# Patient Record
Sex: Male | Born: 1975 | Race: White | Hispanic: No | Marital: Single | Smoking: Never smoker
Health system: Southern US, Community
[De-identification: ages and names within clinical notes are randomized; demographics above are authoritative.]

## PROBLEM LIST (undated history)

## (undated) DIAGNOSIS — Z87898 Personal history of other specified conditions: Secondary | ICD-10-CM

## (undated) DIAGNOSIS — M25512 Pain in left shoulder: Secondary | ICD-10-CM

## (undated) DIAGNOSIS — K219 Gastro-esophageal reflux disease without esophagitis: Secondary | ICD-10-CM

## (undated) DIAGNOSIS — Z8042 Family history of malignant neoplasm of prostate: Secondary | ICD-10-CM

## (undated) DIAGNOSIS — E781 Pure hyperglyceridemia: Secondary | ICD-10-CM

## (undated) DIAGNOSIS — M109 Gout, unspecified: Secondary | ICD-10-CM

## (undated) DIAGNOSIS — Z9989 Dependence on other enabling machines and devices: Secondary | ICD-10-CM

## (undated) DIAGNOSIS — G4733 Obstructive sleep apnea (adult) (pediatric): Secondary | ICD-10-CM

## (undated) HISTORY — PX: OTHER SURGICAL HISTORY: SHX169

## (undated) HISTORY — DX: Morbid (severe) obesity due to excess calories: E66.01

## (undated) HISTORY — DX: Personal history of other specified conditions: Z87.898

## (undated) HISTORY — PX: WRIST SURGERY: SHX841

## (undated) HISTORY — DX: Family history of malignant neoplasm of prostate: Z80.42

## (undated) HISTORY — DX: Obstructive sleep apnea (adult) (pediatric): G47.33

## (undated) HISTORY — DX: Dependence on other enabling machines and devices: Z99.89

## (undated) HISTORY — PX: ESOPHAGOGASTRODUODENOSCOPY: SHX1529

## (undated) HISTORY — DX: Gastro-esophageal reflux disease without esophagitis: K21.9

## (undated) HISTORY — PX: KNEE SURGERY: SHX244

## (undated) HISTORY — DX: Pure hyperglyceridemia: E78.1

## (undated) HISTORY — DX: Pain in left shoulder: M25.512

## (undated) HISTORY — DX: Gout, unspecified: M10.9

---

## 1998-06-23 ENCOUNTER — Emergency Department (HOSPITAL_COMMUNITY): Admission: EM | Admit: 1998-06-23 | Discharge: 1998-06-23 | Payer: Self-pay | Admitting: Emergency Medicine

## 2005-09-22 HISTORY — PX: LASIK: SHX215

## 2010-01-07 ENCOUNTER — Ambulatory Visit: Payer: Self-pay | Admitting: Family Medicine

## 2010-01-07 DIAGNOSIS — M67919 Unspecified disorder of synovium and tendon, unspecified shoulder: Secondary | ICD-10-CM | POA: Insufficient documentation

## 2010-01-07 DIAGNOSIS — M719 Bursopathy, unspecified: Secondary | ICD-10-CM

## 2010-01-07 LAB — CONVERTED CEMR LAB
Bilirubin Urine: NEGATIVE
Blood in Urine, dipstick: NEGATIVE
Glucose, Urine, Semiquant: NEGATIVE
Ketones, urine, test strip: NEGATIVE
Protein, U semiquant: NEGATIVE
Specific Gravity, Urine: 1.02
WBC Urine, dipstick: NEGATIVE
pH: 5.5

## 2010-01-10 LAB — CONVERTED CEMR LAB
Albumin: 4.3 g/dL (ref 3.5–5.2)
BUN: 14 mg/dL (ref 6–23)
Basophils Absolute: 0.1 10*3/uL (ref 0.0–0.1)
CO2: 26 meq/L (ref 19–32)
Calcium: 9.3 mg/dL (ref 8.4–10.5)
Cholesterol: 181 mg/dL (ref 0–200)
Eosinophils Absolute: 0.2 10*3/uL (ref 0.0–0.7)
Glucose, Bld: 91 mg/dL (ref 70–99)
HCT: 42.6 % (ref 39.0–52.0)
HDL: 45.8 mg/dL (ref >39.00)
Hemoglobin: 14.6 g/dL (ref 13.0–17.0)
Lymphs Abs: 1.7 10*3/uL (ref 0.7–4.0)
MCHC: 34.2 g/dL (ref 30.0–36.0)
Neutro Abs: 3.9 10*3/uL (ref 1.4–7.7)
Platelets: 299 10*3/uL (ref 150.0–400.0)
Potassium: 4.1 meq/L (ref 3.5–5.1)
RDW: 13 % (ref 11.5–14.6)
Sodium: 139 meq/L (ref 135–145)
TSH: 1.96 microintl units/mL (ref 0.35–5.50)
Total Bilirubin: 0.5 mg/dL (ref 0.3–1.2)
VLDL: 33.8 mg/dL (ref 0.0–40.0)

## 2010-01-21 ENCOUNTER — Telehealth: Payer: Self-pay | Admitting: Family Medicine

## 2010-07-09 ENCOUNTER — Ambulatory Visit: Payer: Self-pay | Admitting: Family Medicine

## 2010-07-09 LAB — CONVERTED CEMR LAB
Albumin: 4.1 g/dL (ref 3.5–5.2)
Alkaline Phosphatase: 68 units/L (ref 39–117)
BUN: 19 mg/dL (ref 6–23)
Basophils Absolute: 0.1 10*3/uL (ref 0.0–0.1)
Bilirubin Urine: NEGATIVE
CO2: 26 meq/L (ref 19–32)
Calcium: 9.2 mg/dL (ref 8.4–10.5)
Cholesterol: 239 mg/dL — ABNORMAL HIGH (ref 0–200)
Creatinine, Ser: 1.3 mg/dL (ref 0.4–1.5)
Eosinophils Absolute: 0.2 10*3/uL (ref 0.0–0.7)
Glucose, Bld: 99 mg/dL (ref 70–99)
Glucose, Urine, Semiquant: NEGATIVE
Hemoglobin: 14.8 g/dL (ref 13.0–17.0)
Ketones, urine, test strip: NEGATIVE
Lymphocytes Relative: 31.6 % (ref 12.0–46.0)
Lymphs Abs: 2 10*3/uL (ref 0.7–4.0)
MCHC: 34.3 g/dL (ref 30.0–36.0)
Neutro Abs: 3.5 10*3/uL (ref 1.4–7.7)
RDW: 13 % (ref 11.5–14.6)
Sodium: 140 meq/L (ref 135–145)
Specific Gravity, Urine: 1.02
TSH: 2.53 microintl units/mL (ref 0.35–5.50)
Triglycerides: 708 mg/dL — ABNORMAL HIGH (ref 0.0–149.0)
pH: 6

## 2010-07-15 ENCOUNTER — Ambulatory Visit: Payer: Self-pay | Admitting: Family Medicine

## 2010-10-24 NOTE — Assessment & Plan Note (Signed)
Summary: cpx//lch   Vital Signs:  Patient profile:   35 year old male Height:      73 inches Weight:      317 pounds Temp:     98.4 degrees F oral Pulse rate:   88 / minute Pulse rhythm:   regular Resp:     12 per minute BP sitting:   104 / 74  (left arm) Cuff size:   large  Vitals Entered By: Sid Falcon LPN (July 15, 2010 11:32 AM) CC: CPX, labs done Is Patient Diabetic? No   History of Present Illness: Here for CPE.  PMH,SH, AND FH reviewed. Currently not exercising but plans to start soon.    Clinical Review Panels:  Lipid Management   Cholesterol:  239 (07/09/2010)   LDL (bad choesterol):  101 (01/07/2010)   HDL (good cholesterol):  35.60 (07/09/2010)  Diabetes Management   Creatinine:  1.3 (07/09/2010)  CBC   WBC:  6.3 (07/09/2010)   RBC:  4.60 (07/09/2010)   Hgb:  14.8 (07/09/2010)   Hct:  43.1 (07/09/2010)   Platelets:  288.0 (07/09/2010)   MCV  93.7 (07/09/2010)   MCHC  34.3 (07/09/2010)   RDW  13.0 (07/09/2010)   PMN:  55.2 (07/09/2010)   Lymphs:  31.6 (07/09/2010)   Monos:  9.4 (07/09/2010)   Eosinophils:  2.5 (07/09/2010)   Basophil:  1.3 (07/09/2010)  Complete Metabolic Panel   Glucose:  99 (07/09/2010)   Sodium:  140 (07/09/2010)   Potassium:  4.5 (07/09/2010)   Chloride:  107 (07/09/2010)   CO2:  26 (07/09/2010)   BUN:  19 (07/09/2010)   Creatinine:  1.3 (07/09/2010)   Albumin:  4.1 (07/09/2010)   Total Protein:  6.8 (07/09/2010)   Calcium:  9.2 (07/09/2010)   Total Bili:  0.4 (07/09/2010)   Alk Phos:  68 (07/09/2010)   SGPT (ALT):  29 (07/09/2010)   SGOT (AST):  27 (07/09/2010)   Allergies (verified): 1)  Naproxen (Naproxen)  Past History:  Past Surgical History: Last updated: 01/07/2010 Fx  left wrist, 1992-1993 Rt knee 1993  Family History: Last updated: 01/07/2010 Father, prostate cancer Both grandfathers, heart disease, hypertension  Social History: Last updated: 01/07/2010 Occupation:  Merchant navy officer Single Alcohol use-yes Regular exercise-yes Chews tobacco, 1 can per week  Risk Factors: Exercise: yes (01/07/2010)  Risk Factors: Smoking Status: never (01/07/2010) Cans of tobacco/wk: 1 can week (01/07/2010) PMH-FH-SH reviewed for relevance  Review of Systems  The patient denies anorexia, fever, weight loss, weight gain, vision loss, decreased hearing, hoarseness, chest pain, syncope, dyspnea on exertion, peripheral edema, prolonged cough, headaches, hemoptysis, abdominal pain, melena, hematochezia, severe indigestion/heartburn, hematuria, incontinence, genital sores, muscle weakness, suspicious skin lesions, transient blindness, difficulty walking, depression, unusual weight change, abnormal bleeding, enlarged lymph nodes, and testicular masses.    Physical Exam  General:  Well-developed,well-nourished,in no acute distress; alert,appropriate and cooperative throughout examination Head:  Normocephalic and atraumatic without obvious abnormalities. No apparent alopecia or balding. Eyes:  No corneal or conjunctival inflammation noted. EOMI. Perrla. Funduscopic exam benign, without hemorrhages, exudates or papilledema. Vision grossly normal. Ears:  External ear exam shows no significant lesions or deformities.  Otoscopic examination reveals clear canals, tympanic membranes are intact bilaterally without bulging, retraction, inflammation or discharge. Hearing is grossly normal bilaterally. Mouth:  Oral mucosa and oropharynx without lesions or exudates.  Teeth in good repair. Neck:  No deformities, masses, or tenderness noted. Lungs:  Normal respiratory effort, chest expands symmetrically. Lungs are clear to auscultation, no crackles  or wheezes. Heart:  Normal rate and regular rhythm. S1 and S2 normal without gallop, murmur, click, rub or other extra sounds. Abdomen:  Bowel sounds positive,abdomen soft and non-tender without masses, organomegaly or hernias noted. Genitalia:  Testes  bilaterally descended without nodularity, tenderness or masses. No scrotal masses or lesions. No penis lesions or urethral discharge. Msk:  No deformity or scoliosis noted of thoracic or lumbar spine.   Extremities:  No clubbing, cyanosis, edema, or deformity noted with normal full range of motion of all joints.   Neurologic:  alert & oriented X3, cranial nerves II-XII intact, and strength normal in all extremities.   Skin:  no rashes and no suspicious lesions.   Cervical Nodes:  No lymphadenopathy noted Psych:  normally interactive, good eye contact, not anxious appearing, and not depressed appearing.     Impression & Recommendations:  Problem # 1:  ROUTINE GENERAL MEDICAL EXAM@HEALTH  CARE FACL (ICD-V70.0) labs reviewed.  Est regular exercise.  Discussed lowering saturated and trans fats and reduction sugars and starches. Fish oil 3 gm per day for hypertriglcerides.  He prefers to avoid prescription med at this time. Repeat lipids 4-6 months.  Tdap given. Declines flu vaccine.  Other Orders: Tdap => 89yrs IM (16109) Admin 1st Vaccine (60454)  Patient Instructions: 1)  Fish oil 2 to 3 grams per day. 2)  Limit sugar and starch intake. 3)  It is important that you exercise reguarly at least 20 minutes 5 times a week. If you develop chest pain, have severe difficulty breathing, or feel very tired, stop exercising immediately and seek medical attention.  4)  You need to lose weight. Consider a lower calorie diet and regular exercise.  5)  Consider repeat lipids in 4 to 6 months.   Orders Added: 1)  Tdap => 25yrs IM [90715] 2)  Admin 1st Vaccine [90471] 3)  Est. Patient 18-39 years [99395]   Immunizations Administered:  Tetanus Vaccine:    Vaccine Type: Tdap    Site: left deltoid    Mfr: GlaxoSmithKline    Dose: 0.5 ml    Route: IM    Given by: Sid Falcon LPN    Exp. Date: 07/11/2012    Lot #: [id number]    VIS given: 08/09/08 version given July 15, 2010.   Immunizations  Administered:  Tetanus Vaccine:    Vaccine Type: Tdap    Site: left deltoid    Mfr: GlaxoSmithKline    Dose: 0.5 ml    Route: IM    Given by: Sid Falcon LPN    Exp. Date: 07/11/2012    Lot #: [id number]    VIS given: 08/09/08 version given July 15, 2010.

## 2010-10-24 NOTE — Progress Notes (Signed)
Summary: return a call, ongoing left shoulder pain  Phone Note Call from Patient Call back at Home Phone [phone]   Caller: Patient---live call Summary of Call: returning a call about labs. Initial call taken by: Warnell Forester,  Jan 21, 2010 9:09 AM  Follow-up for Phone Call        Pt informed of labs, reports he has a couple of drinks per week.  Pt will return for repeat hepatic panel in 3-4 weeks.  Pt C/O ongoing left shoulder pain, injection did not help much.  Questioning what is the next step to determine cause of pain. Follow-up by: Sid Falcon LPN,  Jan 22, 980 10:24 AM  Additional Follow-up for Phone Call Additional follow up Details #1::        We need to image shoulder but at this point I would rec ortho evaluation given duration of his symptoms.  See if pt has orthopedic preference. Additional Follow-up by: Evelena Peat MD,  Jan 21, 2010 11:50 AM    Additional Follow-up for Phone Call Additional follow up Details #2::    Yes, Dr Dominica Severin. Evelena Peat MD  Jan 21, 2010 5:25 PM  Will set up referral to [facility].  I'm not sure if Dr Amanda Pea sees shoulder problems. We will confirm. Follow-up by: Evelena Peat MD,  Jan 21, 2010 5:27 PM  Additional Follow-up for Phone Call Additional follow up Details #3:: Details for Additional Follow-up Action Taken: pt has an appt with dr Amanda Pea on 02-20-2010 @ 2:30. Pt is aware of appt. Additional Follow-up by: Warnell Forester,  Jan 22, 2010 9:58 AM

## 2010-10-24 NOTE — Assessment & Plan Note (Signed)
Summary: NEW PT EST // RS   Vital Signs:  Patient profile:   35 year old male Height:      73 inches Weight:      313 pounds BMI:     41.44 Temp:     98.3 degrees F oral Pulse rate:   72 / minute Pulse rhythm:   regular Resp:     12 per minute BP sitting:   118 / 80  (left arm) Cuff size:   regular  Vitals Entered By: Sid Falcon LPN (January 07, 2010 11:38 AM)  Nutrition Counseling: Patient's BMI is greater than 25 and therefore counseled on weight management options. CC: New to establisn, pt fasting, left shoulder pain   History of Present Illness: new patient to establish care and for complete physical examination.  Past medical history reviewed. No real chronic medical problems. Has had a couple orthopedic surgeries related to football injuries. 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 Takes no regular medications. No known drug allergies.  Family history significant for father prostate cancer. Grandfather with heart disease.  Married. Occasional alcohol use. Occasional chewing tobacco use. Does not smoke.  Left shoulder pains past 6 months. Old football injury left shoulder. Frequently lifts weights and this exacerbates. Symptoms actually somewhat better after initial exercise and warming up. Pain exacerbated by abduction and internal rotation. No history of rotator cuff tear  Preventive Screening-Counseling & Management  Alcohol-Tobacco     Smoking Status: never     Cans of tobacco/week: 1 can week  Caffeine-Diet-Exercise     Does Patient Exercise: yes  Allergies (verified): No Known Drug Allergies  Past History:  Family History: Last updated: 01/07/2010 Father, prostate cancer Both grandfathers, heart disease, hypertension  Social History: Last updated: 01/07/2010 Occupation:  Emergency planning/management officer Single Alcohol use-yes Regular exercise-yes Chews tobacco, 1 can per week  Risk Factors: Exercise: yes (01/07/2010)  Risk  Factors: Smoking Status: never (01/07/2010) Cans of tobacco/wk: 1 can week (01/07/2010)  Past Surgical History: Fx  left wrist, 1992-1993 Rt knee 1993  Family History: Father, prostate cancer Both grandfathers, heart disease, hypertension  Social History: Occupation:  Emergency planning/management officer Single Alcohol use-yes Regular exercise-yes Chews tobacco, 1 can per week Smoking Status:  never Occupation:  employed Does Patient Exercise:  yes  Review of Systems  The patient denies anorexia, fever, weight loss, weight gain, vision loss, decreased hearing, hoarseness, chest pain, syncope, dyspnea on exertion, peripheral edema, prolonged cough, headaches, hemoptysis, abdominal pain, melena, hematochezia, severe indigestion/heartburn, hematuria, incontinence, genital sores, muscle weakness, suspicious skin lesions, transient blindness, difficulty walking, depression, unusual weight change, abnormal bleeding, enlarged lymph nodes, and testicular masses.    Physical Exam  General:  Well-developed,well-nourished,in no acute distress; alert,appropriate and cooperative throughout examination Head:  Normocephalic and atraumatic without obvious abnormalities. No apparent alopecia or balding. Eyes:  No corneal or conjunctival inflammation noted. EOMI. Perrla. Funduscopic exam benign, without hemorrhages, exudates or papilledema. Vision grossly normal. Ears:  External ear exam shows no significant lesions or deformities.  Otoscopic examination reveals clear canals, tympanic membranes are intact bilaterally without bulging, retraction, inflammation or discharge. Hearing is grossly normal bilaterally. Mouth:  Oral mucosa and oropharynx without lesions or exudates.  Teeth in good repair. Neck:  No deformities, masses, or tenderness noted. Lungs:  Normal respiratory effort, chest expands symmetrically. Lungs are clear to auscultation, no crackles or wheezes. Heart:  Normal rate and regular rhythm. S1 and S2  normal without gallop, murmur, click, rub or other extra  sounds. Abdomen:  Bowel sounds positive,abdomen soft and non-tender without masses, organomegaly or hernias noted. Genitalia:  Testes bilaterally descended without nodularity, tenderness or masses. No scrotal masses or lesions. No penis lesions or urethral discharge. Msk:  left shoulder reveals no visible swelling or erythema. No a.c. joint tenderness. No bicipital tenderness. Pain with internal rotation and abduction against resistance. No definite weakness Extremities:  No clubbing, cyanosis, edema, or deformity noted with normal full range of motion of all joints.   Neurologic:  alert & oriented X3, cranial nerves II-XII intact, strength normal in all extremities, and DTRs symmetrical and normal.     Impression & Recommendations:  Problem # 1:  Preventive Health Care (ICD-V70.0) we'll check tetanus immunization status. Continue regular exercise. Obtain screening labs  Problem # 2:  ROTATOR CUFF SYNDROME, LEFT (ICD-726.10) discussed risk and benefits of steroid injection and patient wished to proceed. Shoulder prepped with Betadine. Using 23-gauge 1/2 inch needle 40 mg Depo-Medrol and 2 cc plain Xylocaine injected posterior lateral approach without difficulty. Consider plain films 2 weeks if no improvement Orders: Depo- Medrol 40mg  (J1030) Joint Aspirate / Injection, Large (20610)  Other Orders: Venipuncture (04540) TLB-Lipid Panel (80061-LIPID) TLB-BMP (Basic Metabolic Panel-BMET) (80048-METABOL) TLB-CBC Platelet - w/Differential (85025-CBCD) TLB-Hepatic/Liver Function Pnl (80076-HEPATIC) TLB-TSH (Thyroid Stimulating Hormone) (84443-TSH) UA Dipstick w/o Micro (manual) (98119)  Patient Instructions: 1)  call or be in touch within 2 weeks if shoulder pain not improving after injection  Laboratory Results   Urine Tests    Routine Urinalysis   Color: yellow Appearance: Clear Glucose: negative   (Normal Range:  Negative) Bilirubin: negative   (Normal Range: Negative) Ketone: negative   (Normal Range: Negative) Spec. Gravity: 1.020   (Normal Range: 1.003-1.035) Blood: negative   (Normal Range: Negative) pH: 5.5   (Normal Range: 5.0-8.0) Protein: negative   (Normal Range: Negative) Urobilinogen: 0.2   (Normal Range: 0-1) Nitrite: negative   (Normal Range: Negative) Leukocyte Esterace: negative   (Normal Range: Negative)    Comments: Rita Ohara  January 07, 2010 2:41 PM

## 2010-10-30 ENCOUNTER — Ambulatory Visit (INDEPENDENT_AMBULATORY_CARE_PROVIDER_SITE_OTHER): Payer: BC Managed Care – PPO | Admitting: Family Medicine

## 2010-10-30 ENCOUNTER — Encounter: Payer: Self-pay | Admitting: Family Medicine

## 2010-10-30 VITALS — BP 140/84 | Temp 99.3°F | Ht 73.5 in | Wt 318.0 lb

## 2010-10-30 DIAGNOSIS — J020 Streptococcal pharyngitis: Secondary | ICD-10-CM

## 2010-10-30 DIAGNOSIS — J069 Acute upper respiratory infection, unspecified: Secondary | ICD-10-CM

## 2010-10-30 LAB — POCT RAPID STREP A (OFFICE): Rapid Strep A Screen: NEGATIVE

## 2010-10-30 MED ORDER — AZITHROMYCIN 250 MG PO TABS: ORAL_TABLET | ORAL | Status: AC

## 2010-10-30 NOTE — Progress Notes (Signed)
  Subjective:    Patient ID: Scott Norris, male    DOB: [birth year], 35 y.o.   MRN: [id number]  HPI   Patient seen with onset of illness about one week ago. Initially developed sore throat followed by some nasal congestion. Felt somewhat better over the weekend and now has had second wave of illness with worsening sore throat , cough productive of yellow sputum and now low-grade fever. Increased malaise. Chills  Off and on. No ill contacts. Patient is nonsmoker. No significant chronic medical problems. He is trying Mucinex DM with some improvement in cough. Denies any nausea, vomiting, or diarrhea  Review of Systems  see history of present illness    Objective:   Physical Exam      patient alert and in no distress  Oropharynx is moist and pink mucosa  Neck supple with no adenopathy  Nasal exam unremarkable  Chest clear to auscultation  Heart regular rhythm and rate  Skin no rash   Assessment & Plan:   Patient presents with upper respiratory infection with second wave of illness concerning for possible bacterial sinusitis  Start Zithromax for 5 days. Take over-the-counter Mucinex and saline nasal irrigation

## 2010-11-01 ENCOUNTER — Telehealth: Payer: Self-pay | Admitting: Family Medicine

## 2010-11-01 DIAGNOSIS — R05 Cough: Secondary | ICD-10-CM

## 2010-11-01 MED ORDER — HYDROCODONE-HOMATROPINE 5-1.5 MG/5ML PO SYRP: 120.0000 mL | ORAL_SOLUTION | Freq: Four times a day (QID) | ORAL | Status: DC | PRN

## 2010-11-01 NOTE — Telephone Encounter (Signed)
Hycodan cough syrup one tsp po q 4-6 hours prn cough disp 120 ml with no refill.

## 2010-11-01 NOTE — Telephone Encounter (Signed)
Pt would like to have cough medication called to [facility].

## 2010-11-01 NOTE — Telephone Encounter (Signed)
Pt informed of Rx called in.

## 2011-04-01 ENCOUNTER — Other Ambulatory Visit (INDEPENDENT_AMBULATORY_CARE_PROVIDER_SITE_OTHER): Payer: BC Managed Care – PPO

## 2011-04-01 DIAGNOSIS — Z0000 Encounter for general adult medical examination without abnormal findings: Secondary | ICD-10-CM

## 2011-04-01 LAB — CBC WITH DIFFERENTIAL/PLATELET
Basophils Absolute: 0.1 10*3/uL (ref 0.0–0.1)
Eosinophils Relative: 3.8 % (ref 0.0–5.0)
Hemoglobin: 14.4 g/dL (ref 13.0–17.0)
Lymphocytes Relative: 30.2 % (ref 12.0–46.0)
Monocytes Relative: 9.6 % (ref 3.0–12.0)
Neutro Abs: 3.8 10*3/uL (ref 1.4–7.7)
Platelets: 274 10*3/uL (ref 150.0–400.0)
RDW: 12.3 % (ref 11.5–14.6)
WBC: 6.9 10*3/uL (ref 4.5–10.5)

## 2011-04-01 LAB — HEPATIC FUNCTION PANEL
Alkaline Phosphatase: 63 U/L (ref 39–117)
Bilirubin, Direct: 0 mg/dL (ref 0.0–0.3)
Total Bilirubin: 0.3 mg/dL (ref 0.3–1.2)
Total Protein: 7.1 g/dL (ref 6.0–8.3)

## 2011-04-01 LAB — BASIC METABOLIC PANEL
Calcium: 8.9 mg/dL (ref 8.4–10.5)
GFR: 81.06 mL/min (ref >60.00)
Glucose, Bld: 103 mg/dL — ABNORMAL HIGH (ref 70–99)
Potassium: 4.2 mEq/L (ref 3.5–5.1)
Sodium: 136 mEq/L (ref 135–145)

## 2011-04-01 LAB — TSH: TSH: 3.16 u[IU]/mL (ref 0.35–5.50)

## 2011-04-01 LAB — LIPID PANEL
HDL: 31.9 mg/dL — ABNORMAL LOW (ref >39.00)
Triglycerides: 1489 mg/dL — ABNORMAL HIGH (ref 0.0–149.0)
VLDL: 297.8 mg/dL — ABNORMAL HIGH (ref 0.0–40.0)

## 2011-04-01 LAB — LDL CHOLESTEROL, DIRECT: Direct LDL: 91.8 mg/dL

## 2011-04-01 LAB — URINALYSIS
Leukocytes, UA: NEGATIVE
Nitrite: NEGATIVE
Specific Gravity, Urine: 1.025 (ref 1.000–1.030)
Urobilinogen, UA: 0.2 (ref 0.0–1.0)
pH: 6 (ref 5.0–8.0)

## 2011-04-08 ENCOUNTER — Ambulatory Visit (INDEPENDENT_AMBULATORY_CARE_PROVIDER_SITE_OTHER): Payer: BC Managed Care – PPO | Admitting: Family Medicine

## 2011-04-08 ENCOUNTER — Encounter: Payer: Self-pay | Admitting: Family Medicine

## 2011-04-08 VITALS — BP 138/86 | HR 80 | Temp 98.0°F | Resp 12 | Ht 74.0 in | Wt 332.0 lb

## 2011-04-08 DIAGNOSIS — Z0000 Encounter for general adult medical examination without abnormal findings: Secondary | ICD-10-CM

## 2011-04-08 DIAGNOSIS — E781 Pure hyperglyceridemia: Secondary | ICD-10-CM

## 2011-04-08 MED ORDER — OMEGA-3-ACID ETHYL ESTERS 1 G PO CAPS: 2.0000 g | ORAL_CAPSULE | Freq: Two times a day (BID) | ORAL | Status: DC

## 2011-04-08 NOTE — Patient Instructions (Signed)
Reduce sugars and starches and work on weight loss and exercise.

## 2011-04-08 NOTE — Progress Notes (Signed)
  Subjective:    Patient ID: Scott Norris, male    DOB: Feb 10, 1976, 35 y.o.   MRN: [id number]  HPI Patient here for complete physical. He has no chronic medical problems. Inconsistent exercise recently. Has had some weight gain and poor diet compliance. Family history significant for father with prostate cancer. Patient does not take any medications.  Past Medical History  Diagnosis Date  . Hyperlipidemia    No past surgical history on file.  reports that he has never smoked. He does not have any smokeless tobacco history on file. His alcohol and drug histories not on file. family history includes Cancer (age of onset:64) in his father. Allergies  Allergen Reactions  . Naproxen     REACTION: GI upset       Review of Systems  Constitutional: Negative for fever, activity change, appetite change and fatigue.  HENT: Negative for ear pain, congestion and trouble swallowing.   Eyes: Negative for pain and visual disturbance.  Respiratory: Negative for cough, shortness of breath and wheezing.   Cardiovascular: Negative for chest pain and palpitations.  Gastrointestinal: Negative for nausea, vomiting, abdominal pain, diarrhea, constipation, blood in stool, abdominal distention and rectal pain.  Genitourinary: Negative for dysuria, hematuria and testicular pain.  Musculoskeletal: Negative for joint swelling and arthralgias.  Skin: Negative for rash.  Neurological: Negative for dizziness, syncope and headaches.  Hematological: Negative for adenopathy.  Psychiatric/Behavioral: Negative for confusion and dysphoric mood.       Objective:   Physical Exam  Constitutional: He is oriented to person, place, and time. He appears well-developed and well-nourished. No distress.  HENT:  Head: Normocephalic and atraumatic.  Right Ear: External ear normal.  Left Ear: External ear normal.  Mouth/Throat: Oropharynx is clear and moist.  Eyes: Conjunctivae and EOM are normal. Pupils are equal,  round, and reactive to light.  Neck: Normal range of motion. Neck supple. No thyromegaly present.  Cardiovascular: Normal rate, regular rhythm and normal heart sounds.   No murmur heard. Pulmonary/Chest: No respiratory distress. He has no wheezes. He has no rales.  Abdominal: Soft. Bowel sounds are normal. He exhibits no distension and no mass. There is no tenderness. There is no rebound and no guarding.  Musculoskeletal: He exhibits no edema.  Lymphadenopathy:    He has no cervical adenopathy.  Neurological: He is alert and oriented to person, place, and time. He displays normal reflexes. No cranial nerve deficit.  Skin: No rash noted.  Psychiatric: He has a normal mood and affect.          Assessment & Plan:  Complete physical. Labs reviewed with patient and significant for glucose 103, elevated lipids with cholesterol 253, HDL 31 and triglycerides 1489. Normal TSH. Discussed concerns about risk for pancreatitis with triglyceride. Work on weight loss and sugars starch reduction. Start Lovaza 2 g twice a day and reassess blood pressure, glucose, and fasting lipids in 3 months

## 2011-07-08 ENCOUNTER — Ambulatory Visit: Payer: BC Managed Care – PPO | Admitting: Family Medicine

## 2013-09-22 DIAGNOSIS — Z87898 Personal history of other specified conditions: Secondary | ICD-10-CM

## 2013-09-22 HISTORY — DX: Personal history of other specified conditions: Z87.898

## 2014-12-22 HISTORY — DX: Morbid (severe) obesity due to excess calories: E66.01

## 2014-12-27 ENCOUNTER — Ambulatory Visit: Payer: Self-pay | Admitting: Family Medicine

## 2015-01-03 ENCOUNTER — Ambulatory Visit: Payer: Self-pay | Admitting: Family Medicine

## 2015-01-08 ENCOUNTER — Other Ambulatory Visit (INDEPENDENT_AMBULATORY_CARE_PROVIDER_SITE_OTHER): Payer: Managed Care, Other (non HMO)

## 2015-01-08 DIAGNOSIS — Z0000 Encounter for general adult medical examination without abnormal findings: Secondary | ICD-10-CM

## 2015-01-08 DIAGNOSIS — R7989 Other specified abnormal findings of blood chemistry: Secondary | ICD-10-CM | POA: Diagnosis not present

## 2015-01-08 LAB — LIPID PANEL
Cholesterol: 221 mg/dL — ABNORMAL HIGH (ref 0–200)
HDL: 52.2 mg/dL (ref >39.00)
NonHDL: 168.8
Total CHOL/HDL Ratio: 4
Triglycerides: 245 mg/dL — ABNORMAL HIGH (ref 0.0–149.0)
VLDL: 49 mg/dL — ABNORMAL HIGH (ref 0.0–40.0)

## 2015-01-08 LAB — CBC WITH DIFFERENTIAL/PLATELET
BASOS ABS: 0.1 10*3/uL (ref 0.0–0.1)
Basophils Relative: 0.7 % (ref 0.0–3.0)
EOS PCT: 4.5 % (ref 0.0–5.0)
Eosinophils Absolute: 0.4 10*3/uL (ref 0.0–0.7)
HCT: 45.5 % (ref 39.0–52.0)
HEMOGLOBIN: 15.4 g/dL (ref 13.0–17.0)
LYMPHS PCT: 21.9 % (ref 12.0–46.0)
Lymphs Abs: 1.9 10*3/uL (ref 0.7–4.0)
MCHC: 33.8 g/dL (ref 30.0–36.0)
MCV: 91.7 fl (ref 78.0–100.0)
Monocytes Absolute: 0.7 10*3/uL (ref 0.1–1.0)
Monocytes Relative: 7.9 % (ref 3.0–12.0)
Neutro Abs: 5.6 10*3/uL (ref 1.4–7.7)
Neutrophils Relative %: 65 % (ref 43.0–77.0)
PLATELETS: 312 10*3/uL (ref 150.0–400.0)
RBC: 4.97 Mil/uL (ref 4.22–5.81)
RDW: 13.4 % (ref 11.5–15.5)
WBC: 8.6 10*3/uL (ref 4.0–10.5)

## 2015-01-08 LAB — COMPREHENSIVE METABOLIC PANEL
ALK PHOS: 70 U/L (ref 39–117)
ALT: 51 U/L (ref 0–53)
AST: 35 U/L (ref 0–37)
Albumin: 4.4 g/dL (ref 3.5–5.2)
BILIRUBIN TOTAL: 0.6 mg/dL (ref 0.2–1.2)
BUN: 15 mg/dL (ref 6–23)
CHLORIDE: 100 meq/L (ref 96–112)
CO2: 29 mEq/L (ref 19–32)
CREATININE: 1.12 mg/dL (ref 0.40–1.50)
Calcium: 9.9 mg/dL (ref 8.4–10.5)
GFR: 77.75 mL/min (ref >60.00)
GLUCOSE: 88 mg/dL (ref 70–99)
Potassium: 4.7 mEq/L (ref 3.5–5.1)
Sodium: 136 mEq/L (ref 135–145)
TOTAL PROTEIN: 7.2 g/dL (ref 6.0–8.3)

## 2015-01-08 LAB — TSH: TSH: 3.36 u[IU]/mL (ref 0.35–4.50)

## 2015-01-09 LAB — LDL CHOLESTEROL, DIRECT: LDL DIRECT: 131 mg/dL

## 2015-01-12 ENCOUNTER — Ambulatory Visit (INDEPENDENT_AMBULATORY_CARE_PROVIDER_SITE_OTHER): Payer: Managed Care, Other (non HMO) | Admitting: Family Medicine

## 2015-01-12 ENCOUNTER — Encounter: Payer: Self-pay | Admitting: Family Medicine

## 2015-01-12 VITALS — BP 142/82 | HR 98 | Temp 98.2°F | Resp 20 | Ht 74.0 in | Wt 385.0 lb

## 2015-01-12 DIAGNOSIS — E782 Mixed hyperlipidemia: Secondary | ICD-10-CM | POA: Diagnosis not present

## 2015-01-12 DIAGNOSIS — G4733 Obstructive sleep apnea (adult) (pediatric): Secondary | ICD-10-CM

## 2015-01-12 DIAGNOSIS — Z125 Encounter for screening for malignant neoplasm of prostate: Secondary | ICD-10-CM

## 2015-01-12 DIAGNOSIS — Z0000 Encounter for general adult medical examination without abnormal findings: Secondary | ICD-10-CM

## 2015-01-12 DIAGNOSIS — Z8042 Family history of malignant neoplasm of prostate: Secondary | ICD-10-CM | POA: Diagnosis not present

## 2015-01-12 MED ORDER — FLUCONAZOLE 150 MG PO TABS: ORAL_TABLET | ORAL | Status: DC

## 2015-01-12 NOTE — Progress Notes (Signed)
Pre visit review using our clinic review tool, if applicable. No additional management support is needed unless otherwise documented below in the visit note. 

## 2015-01-12 NOTE — Progress Notes (Signed)
Office Note 01/12/2015  CC:  Chief Complaint  Patient presents with  . Establish Care   HPI:  Scott Norris is a 39 y.o. White male who is here to establish care.  Patient's most recent primary MD: Dr. Caryl NeverBurchette locally, last saw him 2012.  [place] then [place] [facility] [place] and saw a dr. Fredia SorrowBailes.  He has now relocated to [place] again. Old records in EPIC/HL were reviewed prior to or during today's visit.  Pt has had a sleep study about 6 yrs ago.  Has a CPAP machine that has been broken for 6-8 months  Says sleep is impaired, daytime sleepiness has returned.  Needs pulm referral to get another sleep study/CPAP re-initiated.  Asks for CPE today.  He got fasting CBC, CMET, TSH, and FLP 5 d/a and we reviewed these in detail today.  Only abnormality was mild elevation of trigs.    Pt's father dx'd with prostate ca screening at age 39 and pt has never been screened.  Complains of "long" history of itchy rash on lower abdomen down into groin and all over buttocks/thighs.  The skin is a bit flaky and darker pigmented than the surrounding skin.  Has been dx'd with fungal skin infection and was told to try various OTC antifungal meds and says he was rx'd one in the past as well (topical) and none have made a significant difference for any extendeded period of time.    Past Medical History  Diagnosis Date  . Hyperlipemia, mixed   . Morbid obesity 12/2014    BMI 49    Past Surgical History  Procedure Laterality Date  . Wrist surgery      Fractured wrist; no hardware in wrist.  . Knee surgery Right     Arthroscopic  . Lasik  2007    Family History  Problem Relation Age of Onset  . Prostate cancer Father [id number]  . Hypertension Father     History   Social History  . Marital Status: Single    Spouse Name: N/A  . Number of Children: N/A  . Years of Education: N/A   Occupational History  . Not on file.   Social History Main Topics  . Smoking status: Never  Smoker   . Smokeless tobacco: Current User    Types: Snuff  . Alcohol Use: 1.2 oz/week    2 Shots of liquor per week     Comment: 4-5 weekly  . Drug Use: No  . Sexual Activity: Not on file   Other Topics Concern  . Not on file   Social History Narrative   Married, 2 y/o son.   Occupation: Scientist, physiologicalproject manager for commercial construction.   College: [employer]: BS.  Also played football for [employer].   No tobacco.  Drinks 2 bourbon drinks 4-5 days a week.   Dips tobacco.            MEDS: ASA 81mg  qd, rx antifungal pt cannot recall name of--says it is likely expired. Pt has also tried various otc antifungal creams for his tinea cruris.  Allergies  Allergen Reactions  . Naproxen     REACTION: GI upset    ROS Review of Systems  Constitutional: Negative for fever, chills, appetite change and fatigue.  HENT: Negative for congestion, dental problem, ear pain and sore throat.   Eyes: Negative for discharge, redness and visual disturbance.  Respiratory: Negative for cough, chest tightness, shortness of breath and wheezing.  Cardiovascular: Negative for chest pain, palpitations and leg swelling.  Gastrointestinal: Negative for nausea, vomiting, abdominal pain, diarrhea and blood in stool.  Genitourinary: Negative for dysuria, urgency, frequency, hematuria, flank pain and difficulty urinating.  Musculoskeletal: Negative for myalgias, back pain, joint swelling, arthralgias and neck stiffness.  Skin: Negative for pallor and rash.  Neurological: Negative for dizziness, speech difficulty, weakness and headaches.  Hematological: Negative for adenopathy. Does not bruise/bleed easily.  Psychiatric/Behavioral: Positive for sleep disturbance (as per HPI). Negative for confusion. The patient is not nervous/anxious.     PE; Blood pressure 142/82, pulse 98, temperature 98.2 F (36.8 C), temperature source Temporal, resp. rate 20, height  (1.88 m), weight 385 lb (174.635 kg), SpO2 95  %. Gen: Alert, well appearing, morbidly obese WM.  Patient is oriented to person, place, time, and situation. AFFECT: pleasant, lucid thought and speech. ENT: Ears: EACs clear, normal epithelium.  TMs with good light reflex and landmarks bilaterally.  Eyes: no injection, icteris, swelling, or exudate.  EOMI, PERRLA. Nose: no drainage or turbinate edema/swelling.  No injection or focal lesion.  Mouth: lips without lesion/swelling.  Oral mucosa pink and moist.  Dentition intact and without obvious caries or gingival swelling.  Oropharynx without erythema, exudate, or swelling.  Neck: supple/nontender.  No LAD, mass, or TM.  Carotid pulses 2+ bilaterally, without bruits. CV: RRR, no m/r/g.   LUNGS: CTA bilat, nonlabored resps, good aeration in all lung fields. ABD: soft, NT, ND, BS normal.  No hepatospenomegaly or mass.  No bruits. EXT: no clubbing, cyanosis, or edema.  Musculoskeletal: no joint swelling, erythema, warmth, or tenderness.  ROM of all joints intact. Skin - no sores or suspicious lesions.  He has extensive rash on lower abd under pannus, in groin, and on upper thighs and gluteal region--flaky hyperpigmented discoloration, rash not so much palpable as it is visible, borders well demarcated.  No skin maceration.  No pustules or vesicles. Rectal exam: negative without mass, lesions or tenderness, PROSTATE EXAM: smooth and symmetric without nodules or tenderness.   Pertinent labs:  Lab Results  Component Value Date   TSH 3.36 01/08/2015   Lab Results  Component Value Date   WBC 8.6 01/08/2015   HGB 15.4 01/08/2015   HCT 45.5 01/08/2015   MCV 91.7 01/08/2015   PLT 312.0 01/08/2015   Lab Results  Component Value Date   CREATININE 1.12 01/08/2015   BUN 15 01/08/2015   NA 136 01/08/2015   K 4.7 01/08/2015   CL 100 01/08/2015   CO2 29 01/08/2015   Lab Results  Component Value Date   ALT 51 01/08/2015   AST 35 01/08/2015   ALKPHOS 70 01/08/2015   BILITOT 0.6 01/08/2015    Lab Results  Component Value Date   CHOL 221* 01/08/2015   Lab Results  Component Value Date   HDL 52.20 01/08/2015   Lab Results  Component Value Date   LDLCALC 101* 01/07/2010   Lab Results  Component Value Date   TRIG 245.0* 01/08/2015   Lab Results  Component Value Date   CHOLHDL 4 01/08/2015    ASSESSMENT AND PLAN:   New pt; will obtain prior PCP [facility].  1) OSA, was on CPAP but machine is broken. Pt indicates that his initial sleep study was 5-6 years ago and therefore I'll ask pulm/sleep medicine to see him b/c he'll need repeat sleep study before any new CPAP machine or settings could be recommended.  2) Hyperlipidemia, mixed.  Mild, no  indication for medication treatment at this time. Emphasized the importance of diet/exercise/wt loss in controlling this problem.  3) Morbid obesity; he is not currently dieting or exercising.  We discussed some basics of this today and how to get started.  4) Tinea cruris, extensive.  This has been refractory to topical treatments.  Will treat with diflucan 150 mg qd x 14d and pt is to continue OTC lamisil cream bid and try to keep the involved skin surfaces dry.  5) Health maintenance exam:  Reviewed age and gender appropriate health maintenance issues (prudent diet, regular exercise, health risks of tobacco and excessive alcohol, use of seatbelts, fire alarms in home, use of sunscreen).  Also reviewed age and gender appropriate health screening as well as vaccine recommendations. UTD on vaccines.  He currently takes an aspirin  qd but I recommended he discontinue this b/c at this time he has no indication for this med. Reviewed HP labs today.  6) Family history of prostate cancer: father at age 39.  Pt wants to be aggressive and I'm fine with starting screening now and repeating annually.  DRE normal today.  PSA drawn today.  An After Visit Summary was printed and given to the patient.  Return in about 1 year  (around 01/12/2016) for annual CPE (fasting).

## 2015-01-13 LAB — PSA: PSA: 0.46 ng/mL (ref <=4.00)

## 2015-01-14 ENCOUNTER — Encounter: Payer: Self-pay | Admitting: Family Medicine

## 2015-01-19 ENCOUNTER — Encounter: Payer: Self-pay | Admitting: Family Medicine

## 2015-06-29 ENCOUNTER — Ambulatory Visit (HOSPITAL_BASED_OUTPATIENT_CLINIC_OR_DEPARTMENT_OTHER)
Admission: RE | Admit: 2015-06-29 | Discharge: 2015-06-29 | Disposition: A | Discharge status: Home / Self Care | Payer: Managed Care, Other (non HMO) | Source: Ambulatory Visit | Attending: Family Medicine | Admitting: Family Medicine

## 2015-06-29 ENCOUNTER — Ambulatory Visit (INDEPENDENT_AMBULATORY_CARE_PROVIDER_SITE_OTHER): Payer: Managed Care, Other (non HMO) | Admitting: Family Medicine

## 2015-06-29 ENCOUNTER — Encounter: Payer: Self-pay | Admitting: Family Medicine

## 2015-06-29 VITALS — BP 140/82 | HR 98 | Temp 98.3°F | Resp 18 | Ht 74.0 in | Wt 386.0 lb

## 2015-06-29 DIAGNOSIS — R079 Chest pain, unspecified: Secondary | ICD-10-CM | POA: Diagnosis not present

## 2015-06-29 DIAGNOSIS — G4733 Obstructive sleep apnea (adult) (pediatric): Secondary | ICD-10-CM | POA: Insufficient documentation

## 2015-06-29 DIAGNOSIS — R0789 Other chest pain: Secondary | ICD-10-CM

## 2015-06-29 DIAGNOSIS — R030 Elevated blood-pressure reading, without diagnosis of hypertension: Secondary | ICD-10-CM

## 2015-06-29 NOTE — Progress Notes (Addendum)
OFFICE VISIT  06/29/2015   CC:  Chief Complaint  Patient presents with  . Chest Pain    tightness on left side of chest, off and on x 3 weeks   HPI:    Patient is a 39 y.o. Caucasian male who presents for 3-4 wk hx of an intermittent tightness/pressure present on L side of chest, comes and goes.  More often feels it after he eats.  Exertion does not make it start or make it worse.  Lasts 3-4 minutes typically, comes and goes throughout the day. Points to area under L pectoral mm.  No nausea, SOB, palpitations, dizziness, or radiation of pain.  No jaw or arm pain.  No excessive activity prior to onset, no trauma.   He can't identify anything that alleviates the feeling.  ROS: feet hurting last few weeks in the metarsal head region/balls of feet.  No change in mild chronic ankle swelling/LL edema 1+. No heartburn or GER with any regularity He reports compliance with CPAP nightly since getting it about 1 mo ago. No PND, orthopnea. Last few weeks has noted intermittent L thigh burning sensation laterally, mostly noted upon awakening in mornings.  No rash.  Past Medical History  Diagnosis Date  . Hyperlipemia, mixed   . Morbid obesity ([facility]) 12/2014    BMI 49  . Family history of prostate cancer     Father  . OSA on CPAP     Past Surgical History  Procedure Laterality Date  . Wrist surgery      Fractured wrist; no hardware in wrist.  . Knee surgery Right     Arthroscopic  . Lasik  2007   MEDS: none   Allergies  Allergen Reactions  . Naproxen     REACTION: GI upset    ROS As per HPI  PE: Blood pressure 140/82, pulse 98, temperature 98.3 F (36.8 C), temperature source Oral, resp. rate 18, height  (1.88 m), weight 386 lb (175.088 kg), SpO2 95 %. Repeat bp 160/100 L forearm.  Repeat in L upper arm was 140/82. Gen: Alert, well appearing, morbidly obese WM.  Patient is oriented to person, place, time, and situation. ZOX:WRUE: no injection, icteris, swelling, or  exudate.  EOMI, PERRLA. Mouth: lips without lesion/swelling.  Oral mucosa pink and moist. Oropharynx without erythema, exudate, or swelling.  Neck - No masses or thyromegaly or limitation in range of motion CV: RRR, no m/r/g.   Chest wall is nontender to palpation. LUNGS: CTA bilat, nonlabored resps, good aeration in all lung fields. ABD: soft, NT, ND, BS normal.  No hepatospenomegaly or mass.  No bruits. EXT: 1+ bilat pitting edema in LL's, with a bit more general hypertrophy to the ankles R>L. No plantar foot tenderness.   LABS:  12 lead EKG today: NSR, rate 89, incomplete RBBB, no hypertrophy or ischemic changes or ectopy present. Low voltage III and aVF.  No prior EKG available for comparison.  Lab Results  Component Value Date   WBC 8.6 01/08/2015   HGB 15.4 01/08/2015   HCT 45.5 01/08/2015   MCV 91.7 01/08/2015   PLT 312.0 01/08/2015     Chemistry      Component Value Date/Time   NA 136 01/08/2015 0849   K 4.7 01/08/2015 0849   CL 100 01/08/2015 0849   CO2 29 01/08/2015 0849   BUN 15 01/08/2015 0849   CREATININE 1.12 01/08/2015 0849      Component Value Date/Time   CALCIUM 9.9 01/08/2015 0849  ALKPHOS 70 01/08/2015 0849   AST 35 01/08/2015 0849   ALT 51 01/08/2015 0849   BILITOT 0.6 01/08/2015 0849     Lab Results  Component Value Date   CHOL 221* 01/08/2015   HDL 52.20 01/08/2015   LDLCALC 101* 01/07/2010   LDLDIRECT 131.0 01/08/2015   TRIG 245.0* 01/08/2015   CHOLHDL 4 01/08/2015   Lab Results  Component Value Date   TSH 3.36 01/08/2015    IMPRESSION AND PLAN:  1) Atypical chest discomfort/pain:  EKG reassuring. Will check CXR and arrange ETT.  2) Elevated bp w/out dx of HTN:  Patient states no prior hx of bp elevation at MD offices or when giving blood.  Patient is to monitor his bp at pharmacy or his home daily for the next week and call these #s to our office.  Spent 50 min with pt today, with >50% of this time spent in counseling and care  coordination regarding the above problems.  An After Visit Summary was printed and given to the patient.  FOLLOW UP: Return for f/u to be determined based on results of w/u.

## 2015-06-29 NOTE — Progress Notes (Signed)
Pre visit review using our clinic review tool, if applicable. No additional management support is needed unless otherwise documented below in the visit note. 

## 2015-07-01 ENCOUNTER — Encounter: Payer: Self-pay | Admitting: Family Medicine

## 2015-07-03 ENCOUNTER — Encounter: Payer: Managed Care, Other (non HMO) | Admitting: Nurse Practitioner

## 2015-07-03 ENCOUNTER — Ambulatory Visit (INDEPENDENT_AMBULATORY_CARE_PROVIDER_SITE_OTHER): Payer: Managed Care, Other (non HMO)

## 2015-07-03 DIAGNOSIS — R079 Chest pain, unspecified: Secondary | ICD-10-CM

## 2015-07-03 LAB — EXERCISE TOLERANCE TEST
CHL RATE OF PERCEIVED EXERTION: 17
CSEPED: 8 min
CSEPEW: 10.4 METS
CSEPHR: 87 %
CSEPPHR: 157 {beats}/min
Exercise duration (sec): 0 s
MPHR: 181 {beats}/min
Rest HR: 90 {beats}/min

## 2015-07-04 ENCOUNTER — Encounter: Payer: Self-pay | Admitting: Family Medicine

## 2015-07-04 NOTE — Progress Notes (Signed)
I think something in his chest wall (muscle/ligament/tendon) is irritated.   It is okay to simply wait and see how it goes over the next month or two. No further testing is needed.-thx

## 2015-11-22 ENCOUNTER — Ambulatory Visit (INDEPENDENT_AMBULATORY_CARE_PROVIDER_SITE_OTHER): Payer: Managed Care, Other (non HMO) | Admitting: Family Medicine

## 2015-11-22 ENCOUNTER — Encounter: Payer: Self-pay | Admitting: Family Medicine

## 2015-11-22 VITALS — BP 134/82 | HR 78 | Temp 98.0°F | Resp 20 | Wt 367.8 lb

## 2015-11-22 DIAGNOSIS — R079 Chest pain, unspecified: Secondary | ICD-10-CM | POA: Insufficient documentation

## 2015-11-22 DIAGNOSIS — R0789 Other chest pain: Secondary | ICD-10-CM | POA: Diagnosis not present

## 2015-11-22 DIAGNOSIS — K219 Gastro-esophageal reflux disease without esophagitis: Secondary | ICD-10-CM | POA: Diagnosis not present

## 2015-11-22 LAB — COMPREHENSIVE METABOLIC PANEL
ALK PHOS: 67 U/L (ref 39–117)
ALT: 42 U/L (ref 0–53)
AST: 30 U/L (ref 0–37)
Albumin: 4.6 g/dL (ref 3.5–5.2)
BUN: 13 mg/dL (ref 6–23)
CO2: 25 meq/L (ref 19–32)
Calcium: 9.6 mg/dL (ref 8.4–10.5)
Chloride: 101 mEq/L (ref 96–112)
Creatinine, Ser: 1.03 mg/dL (ref 0.40–1.50)
GFR: 85.25 mL/min (ref >60.00)
GLUCOSE: 89 mg/dL (ref 70–99)
POTASSIUM: 4.5 meq/L (ref 3.5–5.1)
SODIUM: 135 meq/L (ref 135–145)
TOTAL PROTEIN: 7.2 g/dL (ref 6.0–8.3)
Total Bilirubin: 0.7 mg/dL (ref 0.2–1.2)

## 2015-11-22 LAB — H. PYLORI ANTIBODY, IGG: H Pylori IgG: NEGATIVE

## 2015-11-22 LAB — LIPASE: Lipase: 22 U/L (ref 11.0–59.0)

## 2015-11-22 MED ORDER — OMEPRAZOLE 40 MG PO CPDR: 40.0000 mg | DELAYED_RELEASE_CAPSULE | Freq: Every day | ORAL | Status: DC

## 2015-11-22 MED ORDER — SUCRALFATE 1 G PO TABS: 1.0000 g | ORAL_TABLET | Freq: Three times a day (TID) | ORAL | Status: DC

## 2015-11-22 NOTE — Progress Notes (Signed)
Patient ID: Scott Norris, male   DOB: 1976/05/29, 40 y.o.   MRN: [id number]    Scott Norris , [birth year], 40 y.o., male MRN: [id number]  CC: Chest tightness Subjective: Pt presents for an acute OV with complaints of chest tightness of since October. The pain occurs on the left lower chest area, approximately 6-8 times a day. He states it happens after every meal, and occasionally intermittently throughout the day. He states it did happen this morning and he did not eat anything, then on further questioning states he did have coffee. He reports the pain starts approximately 20-30 minutes after meals, and feels like a burning sensation. He states the pain can last anywhere from a few minutes to an hour. Pain resolves on its own. He denies any nausea, vomit, diaphoresis, shortness of breath. Patient has gained weight and is obese. Patient denies any prior history of reflux, but admits to heartburn at least 5-6 times a week. He was evaluated for the same symptoms in October, he states the symptoms have not changed. He had a exercise stress test at that time that was normal.  Patient does not consume alcohol. He does have an elevated triglyceride level by prior lipid panel. He states he used to take fish oil, but he hasn't been taking that for a few months. He denies any changes in his bowel habits, melena or hematochezia. He denies any family history of bowel disease.   Allergies  Allergen Reactions  . Naproxen     REACTION: GI upset   Social History  Substance Use Topics  . Smoking status: Never Smoker   . Smokeless tobacco: Former Systems developer    Types: Snuff  . Alcohol Use: 1.2 oz/week    2 Shots of liquor per week     Comment: 4-5 weekly   Past Medical History  Diagnosis Date  . Hypertriglyceridemia     Trigs>1400 in 2012: lovaza rx'd  . Morbid obesity ([place]) 12/2014    BMI 49  . Family history of prostate cancer     Father  . OSA on CPAP   . GERD (gastroesophageal reflux disease)   .  History of chest pain 09/2013    Ruled out for ACS at [facility] ED.  Also dx'd with musculoskeletal CP, costochondritis, and GERD for c/o CP in the past.  ETT normal 06/2015.   Past Surgical History  Procedure Laterality Date  . Wrist surgery      Fractured wrist; no hardware in wrist.  . Knee surgery Right     Arthroscopic  . Lasik  2007  . Ett  06/2015    Normal   Family History  Problem Relation Age of Onset  . Prostate cancer Father 59  . Hypertension Father      Medication List    Notice  As of 11/22/2015 11:24 AM   You have not been prescribed any medications.      ROS: Negative, with the exception of above mentioned in HPI  Objective:  BP 134/82 mmHg  Pulse 78  Temp(Src) 98 F (36.7 C)  Resp 20  Wt 367 lb 12 oz (166.81 kg)  SpO2 98% Body mass index is 47.2 kg/(m^2). Gen: Afebrile. No acute distress. Nontoxic in appearance, well-developed, well-nourished, Caucasian obese male. HENT: AT. .MMM, no oral lesions.  Eyes:Pupils Equal Round Reactive to light, Extraocular movements intact,  Conjunctiva without redness, discharge or icterus. Neck/lymp/endocrine: Supple, no lymphadenopathy CV: RRR no murmur, clicks, gallops or rubs,  no edema, +2/4  P posterior tibialis pulses Chest: CTAB, no wheeze or crackles. Good air movement, normal resp effort.  Abd: Soft. Morbidly obese. NTND. BS present. No Masses palpated. No rebound or guarding. No hepatosplenomegaly. Neuro: Normal gait. PERLA. EOMi. Alert. Oriented x3  Psych: Normal affect, dress and demeanor. Normal speech. Normal thought content and judgment.  Assessment/Plan: Scott Norris is a 40 y.o. male present for acute OV for  1. Other chest pain - Discussed with patient possible etiologies of discomfort. Differential diagnosis: Hiatal hernia, pleuritic/pericardial catch, pancreatic etiology, PUD/GERD. Recent CXR reviewed and normal.  - Exam today is normal. Patient's report of pain is more left-sided, discussed  diaphragmatic irritation pain. Patient had a stress test in October which was normal. By EMR reviewed does have elevated triglycerides.  - Comp Met (CMET) - Lipase - H. pylori antibody, IgG - Patient will be called with results once available. Discussed treatment with Prilosec and Carafate. Patient is to monitor his diet closely, following a GERD diet. This was provided to him today. AVS information on hiatal hernia was also provided to patient. He was encouraged to lose weight, if this is a hiatal hernia would help his symptoms. We also discussed he may need GI referral for all studies come back normal and he does not respond to the GERD treatment.  2. Gastroesophageal reflux disease, esophagitis presence not specified - omeprazole (PRILOSEC) 40 MG capsule; Take 1 capsule (40 mg total) by mouth daily.  Dispense: 30 capsule; Refill: 3 - sucralfate (CARAFATE) 1 g tablet; Take 1 tablet (1 g total) by mouth 4 (four) times daily -  with meals and at bedtime.  Dispense: 90 tablet; Refill: 1 - GERD diet - Follow-up in 4 weeks if no improvement in symptoms, sooner if worsening.  electronically signed by:  Howard Pouch, DO  [facility]

## 2015-11-22 NOTE — Patient Instructions (Signed)
Hiatal Hernia A hiatal hernia occurs when part of your stomach slides above the muscle that separates your abdomen from your chest (diaphragm). You can be born with a hiatal hernia (congenital), or it may develop over time. In almost all cases of hiatal hernia, only the top part of the stomach pushes through.  Many people have a hiatal hernia with no symptoms. The larger the hernia, the more likely that you will have symptoms. In some cases, a hiatal hernia allows stomach acid to flow back into the tube that carries food from your mouth to your stomach (esophagus). This may cause heartburn symptoms. Severe heartburn symptoms may mean you have developed a condition called gastroesophageal reflux disease (GERD).  CAUSES  Hiatal hernias are caused by a weakness in the opening (hiatus) where your esophagus passes through your diaphragm to attach to the upper part of your stomach. You may be born with a weakness in your hiatus, or a weakness can develop. RISK FACTORS Older age is a major risk factor for a hiatal hernia. Anything that increases pressure on your diaphragm can also increase your risk of a hiatal hernia. This includes:  Pregnancy.  Excess weight.  Frequent constipation. SIGNS AND SYMPTOMS  People with a hiatal hernia often have no symptoms. If symptoms develop, they are almost always caused by GERD. They may include:  Heartburn.  Belching.  Indigestion.  Trouble swallowing.  Coughing or wheezing.  Sore throat.  Hoarseness.  Chest pain. DIAGNOSIS  A hiatal hernia is sometimes found during an exam for another problem. Your health care provider may suspect a hiatal hernia if you have symptoms of GERD. Tests may be done to diagnose GERD. These may include:  X-rays of your stomach or chest.  An upper gastrointestinal (GI) series. This is an X-ray exam of your GI tract involving the use of a chalky liquid that you swallow. The liquid shows up clearly on the X-ray.  Endoscopy.  This is a procedure to look into your stomach using a thin, flexible tube that has a tiny camera and light on the end of it. TREATMENT  If you have no symptoms, you may not need treatment. If you have symptoms, treatment may include:  Dietary and lifestyle changes to help reduce GERD symptoms.  Medicines. These may include:  Over-the-counter antacids.  Medicines that make your stomach empty more quickly.  Medicines that block the production of stomach acid (H2 blockers).  Stronger medicines to reduce stomach acid (proton pump inhibitors).  You may need surgery to repair the hernia if other treatments are not helping. HOME CARE INSTRUCTIONS   Take all medicines as directed by your health care provider.  Quit smoking, if you smoke.  Try to achieve and maintain a healthy body weight.  Eat frequent small meals instead of three large meals a day. This keeps your stomach from getting too full.  Eat slowly.  Do not lie down right after eating.  Do noteat 1-2 hours before bed.   Do not drink beverages with caffeine. These include cola, coffee, cocoa, and tea.  Do not drink alcohol.  Avoid foods that can make symptoms of GERD worse. These may include:  Fatty foods.  Citrus fruits.  Other foods and drinks that contain acid.  Avoid putting pressure on your belly. Anything that puts pressure on your belly increases the amount of acid that may be pushed up into your esophagus.   Avoid bending over, especially after eating.  Raise the head of your bed   by putting blocks under the legs. This keeps your head and esophagus higher than your stomach.  Do not wear tight clothing around your chest or stomach.  Try not to strain when having a bowel movement, when urinating, or when lifting heavy objects. SEEK MEDICAL CARE IF:  Your symptoms are not controlled with medicines or lifestyle changes.  You are having trouble swallowing.  You have coughing or wheezing that will not  go away. SEEK IMMEDIATE MEDICAL CARE IF:  Your pain is getting worse.  Your pain spreads to your arms, neck, jaw, teeth, or back.  You have shortness of breath.  You sweat for no reason.  You feel sick to your stomach (nauseous) or vomit.  You vomit blood.  You have bright red blood in your stools.  You have black, tarry stools.    This information is not intended to replace advice given to you by your health care provider. Make sure you discuss any questions you have with your health care provider.   Document Released: 11/29/2003 Document Revised: 09/29/2014 Document Reviewed: 08/26/2013 Elsevier Interactive Patient Education 2016 [employer].  Food Choices for Gastroesophageal Reflux Disease, Adult When you have gastroesophageal reflux disease (GERD), the foods you eat and your eating habits are very important. Choosing the right foods can help ease the discomfort of GERD. WHAT GENERAL GUIDELINES DO I NEED TO FOLLOW?  Choose fruits, vegetables, whole grains, low-fat dairy products, and low-fat meat, fish, and poultry.  Limit fats such as oils, salad dressings, butter, nuts, and avocado.  Keep a food diary to identify foods that cause symptoms.  Avoid foods that cause reflux. These may be different for different people.  Eat frequent small meals instead of three large meals each day.  Eat your meals slowly, in a relaxed setting.  Limit fried foods.  Cook foods using methods other than frying.  Avoid drinking alcohol.  Avoid drinking large amounts of liquids with your meals.  Avoid bending over or lying down until 2-3 hours after eating. WHAT FOODS ARE NOT RECOMMENDED? The following are some foods and drinks that may worsen your symptoms: Vegetables Tomatoes. Tomato juice. Tomato and spaghetti sauce. Chili peppers. Onion and garlic. Horseradish. Fruits Oranges, grapefruit, and lemon (fruit and juice). Meats High-fat meats, fish, and poultry. This includes  hot dogs, ribs, ham, sausage, salami, and bacon. Dairy Whole milk and chocolate milk. Sour cream. Cream. Butter. Ice cream. Cream cheese.  Beverages Coffee and tea, with or without caffeine. Carbonated beverages or energy drinks. Condiments Hot sauce. Barbecue sauce.  Sweets/Desserts Chocolate and cocoa. Donuts. Peppermint and spearmint. Fats and Oils High-fat foods, including [place] fries and potato chips. Other Vinegar. Strong spices, such as black pepper, white pepper, red pepper, cayenne, curry powder, cloves, ginger, and chili powder. The items listed above may not be a complete list of foods and beverages to avoid. Contact your dietitian for more information.   This information is not intended to replace advice given to you by your health care provider. Make sure you discuss any questions you have with your health care provider.   Document Released: 09/08/2005 Document Revised: 09/29/2014 Document Reviewed: 07/13/2013 Elsevier Interactive Patient Education 2016 Elsevier Inc.   Gastroesophageal Reflux Disease, Adult Normally, food travels down the esophagus and stays in the stomach to be digested. However, when a person has gastroesophageal reflux disease (GERD), food and stomach acid move back up into the esophagus. When this happens, the esophagus becomes sore and inflamed. Over time, GERD can create small  holes (ulcers) in the lining of the esophagus.  CAUSES This condition is caused by a problem with the muscle between the esophagus and the stomach (lower esophageal sphincter, or LES). Normally, the LES muscle closes after food passes through the esophagus to the stomach. When the LES is weakened or abnormal, it does not close properly, and that allows food and stomach acid to go back up into the esophagus. The LES can be weakened by certain dietary substances, medicines, and medical conditions, including:  Tobacco use.  Pregnancy.  Having a hiatal hernia.  Heavy alcohol  use.  Certain foods and beverages, such as coffee, chocolate, onions, and peppermint. RISK FACTORS This condition is more likely to develop in:  People who have an increased body weight.  People who have connective tissue disorders.  People who use NSAID medicines. SYMPTOMS Symptoms of this condition include:  Heartburn.  Difficult or painful swallowing.  The feeling of having a lump in the throat.  Abitter taste in the mouth.  Bad breath.  Having a large amount of saliva.  Having an upset or bloated stomach.  Belching.  Chest pain.  Shortness of breath or wheezing.  Ongoing (chronic) cough or a night-time cough.  Wearing away of tooth enamel.  Weight loss. Different conditions can cause chest pain. Make sure to see your health care provider if you experience chest pain. DIAGNOSIS Your health care provider will take a medical history and perform a physical exam. To determine if you have mild or severe GERD, your health care provider may also monitor how you respond to treatment. You may also have other tests, including:  An endoscopy toexamine your stomach and esophagus with a small camera.  A test thatmeasures the acidity level in your esophagus.  A test thatmeasures how much pressure is on your esophagus.  A barium swallow or modified barium swallow to show the shape, size, and functioning of your esophagus. TREATMENT The goal of treatment is to help relieve your symptoms and to prevent complications. Treatment for this condition may vary depending on how severe your symptoms are. Your health care provider may recommend:  Changes to your diet.  Medicine.  Surgery. HOME CARE INSTRUCTIONS Diet  Follow a diet as recommended by your health care provider. This may involve avoiding foods and drinks such as:  Coffee and tea (with or without caffeine).  Drinks that containalcohol.  Energy drinks and sports drinks.  Carbonated drinks or  sodas.  Chocolate and cocoa.  Peppermint and mint flavorings.  Garlic and onions.  Horseradish.  Spicy and acidic foods, including peppers, chili powder, curry powder, vinegar, hot sauces, and barbecue sauce.  Citrus fruit juices and citrus fruits, such as oranges, lemons, and limes.  Tomato-based foods, such as red sauce, chili, salsa, and pizza with red sauce.  Fried and fatty foods, such as donuts, french fries, potato chips, and high-fat dressings.  High-fat meats, such as hot dogs and fatty cuts of red and white meats, such as rib eye steak, sausage, ham, and bacon.  High-fat dairy items, such as whole milk, butter, and cream cheese.  Eat small, frequent meals instead of large meals.  Avoid drinking large amounts of liquid with your meals.  Avoid eating meals during the 2-3 hours before bedtime.  Avoid lying down right after you eat.  Do not exercise right after you eat. General Instructions  Pay attention to any changes in your symptoms.  Take over-the-counter and prescription medicines only as told by your health care  provider. Do not take aspirin, ibuprofen, or other NSAIDs unless your health care provider told you to do so.  Do not use any tobacco products, including cigarettes, chewing tobacco, and e-cigarettes. If you need help quitting, ask your health care provider.  Wear loose-fitting clothing. Do not wear anything tight around your waist that causes pressure on your abdomen.  Raise (elevate) the head of your bed 6 inches (15cm).  Try to reduce your stress, such as with yoga or meditation. If you need help reducing stress, ask your health care provider.  If you are overweight, reduce your weight to an amount that is healthy for you. Ask your health care provider for guidance about a safe weight loss goal.  Keep all follow-up visits as told by your health care provider. This is important. SEEK MEDICAL CARE IF:  You have new symptoms.  You have  unexplained weight loss.  You have difficulty swallowing, or it hurts to swallow.  You have wheezing or a persistent cough.  Your symptoms do not improve with treatment.  You have a hoarse voice. SEEK IMMEDIATE MEDICAL CARE IF:  You have pain in your arms, neck, jaw, teeth, or back.  You feel sweaty, dizzy, or light-headed.  You have chest pain or shortness of breath.  You vomit and your vomit looks like blood or coffee grounds.  You faint.  Your stool is bloody or black.  You cannot swallow, drink, or eat.   This information is not intended to replace advice given to you by your health care provider. Make sure you discuss any questions you have with your health care provider.   Document Released: 06/18/2005 Document Revised: 05/30/2015 Document Reviewed: 01/03/2015   Peptic Ulcer A peptic ulcer is a sore in the lining of your esophagus (esophageal ulcer), stomach (gastric ulcer), or in the first part of your small intestine (duodenal ulcer). The ulcer causes erosion into the deeper tissue. CAUSES  Normally, the lining of the stomach and the small intestine protects itself from the acid that digests food. The protective lining can be damaged by:  An infection caused by a bacterium called Helicobacter pylori (H. pylori).  Regular use of nonsteroidal anti-inflammatory drugs (NSAIDs), such as ibuprofen or aspirin.  Smoking tobacco. Other risk factors include being older than 50, drinking alcohol excessively, and having a family history of ulcer disease.  SYMPTOMS   Burning pain or gnawing in the area between the chest and the belly button.  Heartburn.  Nausea and vomiting.  Bloating. The pain can be worse on an empty stomach and at night. If the ulcer results in bleeding, it can cause:  Black, tarry stools.  Vomiting of bright red blood.  Vomiting of coffee-ground-looking materials. DIAGNOSIS  A diagnosis is usually made based upon your history and an exam.  Other tests and procedures may be performed to find the cause of the ulcer. Finding a cause will help determine the best treatment. Tests and procedures may include:  Blood tests, stool tests, or breath tests to check for the bacterium H. pylori.  An upper gastrointestinal (GI) series of the esophagus, stomach, and small intestine.  An endoscopy to examine the esophagus, stomach, and small intestine.  A biopsy. TREATMENT  Treatment may include:  Eliminating the cause of the ulcer, such as smoking, NSAIDs, or alcohol.  Medicines to reduce the amount of acid in your digestive tract.  Antibiotic medicines if the ulcer is caused by the H. pylori bacterium.  An upper endoscopy to treat  a bleeding ulcer.  Surgery if the bleeding is severe or if the ulcer created a hole somewhere in the digestive system. HOME CARE INSTRUCTIONS   Avoid tobacco, alcohol, and caffeine. Smoking can increase the acid in the stomach, and continued smoking will impair the healing of ulcers.  Avoid foods and drinks that seem to cause discomfort or aggravate your ulcer.  Only take medicines as directed by your caregiver. Do not substitute over-the-counter medicines for prescription medicines without talking to your caregiver.  Keep any follow-up appointments and tests as directed. SEEK MEDICAL CARE IF:   Your do not improve within 7 days of starting treatment.  You have ongoing indigestion or heartburn. SEEK IMMEDIATE MEDICAL CARE IF:   You have sudden, sharp, or persistent abdominal pain.  You have bloody or dark black, tarry stools.  You vomit blood or vomit that looks like coffee grounds.  You become light-headed, weak, or feel faint.  You become sweaty or clammy. MAKE SURE YOU:   Understand these instructions.  Will watch your condition.  Will get help right away if you are not doing well or get worse.   This information is not intended to replace advice given to you by your health care  provider. Make sure you discuss any questions you have with your health care provider.   Document Released: 09/05/2000 Document Revised: 09/29/2014 Document Reviewed: 04/07/2012 Elsevier Interactive Patient Education 2016 [employer].  [employer] Interactive Patient Education [employer]   I have called in medication to treat GERD/PUD. (omeprazole, Carafate). Follow-up in four weeks.  You will need to try dietary modification per above

## 2015-11-23 ENCOUNTER — Telehealth: Payer: Self-pay | Admitting: Family Medicine

## 2015-11-23 DIAGNOSIS — R0789 Other chest pain: Secondary | ICD-10-CM

## 2015-11-23 NOTE — Telephone Encounter (Signed)
Please call pt: - all labs are normal. - There is one other test (blood), I would like to test, which monitors for "inflammation". - I have placed this in a future order and he can get it collected by lab appt whenever is convenient for him in the next week. - Otherwise f/u 4 weeks. If no improvement in symptoms or worsening symptoms would need to sooner.

## 2015-11-26 NOTE — Telephone Encounter (Signed)
Spoke to patient gave results/instructions. Patient verbalized understanding. Transferred to front desk to have lab appt scheduled.

## 2015-12-20 ENCOUNTER — Encounter: Payer: Self-pay | Admitting: Family Medicine

## 2015-12-20 ENCOUNTER — Ambulatory Visit (INDEPENDENT_AMBULATORY_CARE_PROVIDER_SITE_OTHER): Payer: Managed Care, Other (non HMO) | Admitting: Family Medicine

## 2015-12-20 VITALS — BP 134/87 | HR 87 | Temp 98.1°F | Resp 20 | Ht 74.0 in | Wt 363.5 lb

## 2015-12-20 DIAGNOSIS — R1013 Epigastric pain: Secondary | ICD-10-CM | POA: Diagnosis not present

## 2015-12-20 NOTE — Progress Notes (Signed)
Patient ID: Scott Norris, male   DOB: [birth year], 40 y.o.   MRN: [id number]    Scott Norris , 1975/09/25, 40 y.o., male MRN: [id number]  CC: Chest tightness follow up Subjective:  Epigastric discomfort: Patient reports continued left lower chest/epigstric "tightness". He was tried on PPI and carafate, placed on GERD diet. Pt endorses mild improvement in symptoms, now ONLY occuring with meals, which is a minimal improvement from prior. He states it is tolerable, it is not pain, but is concerning to him. He wants to make sure it is not cancer. The discomfort occurs  towards the end of his meals.  He states the discomfort will last between 30 minutes to 1 hour. He describes the pain as a tightness or burning. No types of meals seem to make a difference on the occurrence of the sensation. Size of the meal does seem to make a difference. GERD diet has improved his heartburn. Patient has lost approximately 23 pounds since October 2016, this was intentional.  Prior note:  Pt presents for an acute OV with complaints of chest tightness since October. The pain occurs on the left lower chest area, approximately 6-8 times a day. He states it happens after every meal, and occasionally intermittently throughout the day. He states it did happen this morning and he did not eat anything, then on further questioning states he did have coffee. He reports the pain starts approximately 20-30 minutes after meals, and feels like a burning sensation. He states the pain can last anywhere from a few minutes to an hour. Pain resolves on its own. He denies any nausea, vomit, diaphoresis, shortness of breath. Patient has gained weight and is obese. Patient denies any prior history of reflux, but admits to heartburn at least 5-6 times a week. He was evaluated for the same symptoms in October, he states the symptoms have not changed. He had a exercise stress test at that time that was normal.  Patient does not consume alcohol. He  does have an elevated triglyceride level by prior lipid panel. He states he used to take fish oil, but he hasn't been taking that for a few months. He denies any changes in his bowel habits, melena or hematochezia.     Allergies  Allergen Reactions  . Naproxen     REACTION: GI upset   Social History  Substance Use Topics  . Smoking status: Never Smoker   . Smokeless tobacco: Former Neurosurgeon    Types: Snuff  . Alcohol Use: 1.2 oz/week    2 Shots of liquor per week     Comment: 4-5 weekly   Past Medical History  Diagnosis Date  . Hypertriglyceridemia     Trigs>1400 in 2012: lovaza rx'd  . Morbid obesity (HCC) 12/2014    BMI 49  . Family history of prostate cancer     Father  . OSA on CPAP   . GERD (gastroesophageal reflux disease)   . History of chest pain 09/2013    Ruled out for ACS at [facility].  Also dx'd with musculoskeletal CP, costochondritis, and GERD for c/o CP in the past.  ETT normal 06/2015.   Past Surgical History  Procedure Laterality Date  . Wrist surgery      Fractured wrist; no hardware in wrist.  . Knee surgery Right     Arthroscopic  . Lasik  2007  . Ett  06/2015    Normal   Family History  Problem Relation Age of Onset  .  Prostate cancer Father [id number]  . Hypertension Father      Medication List       This list is accurate as of: 12/20/15 11:45 AM.  Always use your most recent med list.               omeprazole 40 MG capsule  Commonly known as:  PRILOSEC  Take 1 capsule (40 mg total) by mouth daily.     sucralfate 1 g tablet  Commonly known as:  CARAFATE  Take 1 tablet (1 g total) by mouth 4 (four) times daily -  with meals and at bedtime.        ROS: Negative, with the exception of above mentioned in HPI  Objective:  BP 134/87 mmHg  Pulse 87  Temp(Src) 98.1 F (36.7 C)  Resp 20  Ht 6\' 2"  (1.88 m)  Wt 363 lb 8 oz (164.883 kg)  BMI 46.65 kg/m2  SpO2 95% Body mass index is 46.65 kg/(m^2). Gen: Afebrile. No acute distress. Nontoxic in  appearance, well-developed, well-nourished, Caucasian obese male. HENT: AT. Chili.MMM, no oral lesions.  Eyes:Pupils Equal Round Reactive to light, Extraocular movements intact,  Conjunctiva without redness, discharge or icterus. CV: RRR Abd: Soft. Morbidly obese. NTND. BS present. No Masses palpated. No rebound or guarding. No hepatosplenomegaly. Neuro: Normal gait. PERLA. EOMi. Alert. Oriented x3   Assessment/Plan: Scott Norris is a 40 y.o. male present for follow-up to epigastric/chest discomfort/reflux 1. Other chest pain/epigastric pain - Discussed with patient possible etiologies of discomfort. Differential diagnosis: Hiatal hernia, pleuritic/pericardial catch, pancreatic etiology, PUD/GERD.  - Only mild improvement in symptoms with use of PPI, Carafate and GERD diet. - Discomfort seems to occur now only with meals, and towards the end of meals. - Discussed referral to gastroenterology and patient amendable to referral today. - Gastroenterology referral  2. Gastroesophageal reflux disease, esophagitis presence not specified - Reflux/heartburn seems to be improved on Prilosec Carafate regimen, and GERD diet. - Patient encouraged to continue Prilosec, he can attempt to stop the Carafate, if symptoms return he should restart. - omeprazole (PRILOSEC) 40 MG capsule; Take 1 capsule (40 mg total) by mouth daily.  Dispense: 30 capsule; Refill: 3 - sucralfate (CARAFATE) 1 g tablet; Take 1 tablet (1 g total) by mouth 4 (four) times daily -  with meals and at bedtime.  Dispense: 90 tablet; Refill: 1 - GERD diet continued  Follow-up as needed   electronically signed by:  Felix Pacinienee Shamir Tuzzolino, DO  [facility] [place]

## 2015-12-20 NOTE — Patient Instructions (Signed)
Hiatal Hernia  A hiatal hernia occurs when part of your stomach slides above the muscle that separates your abdomen from your chest (diaphragm). You can be born with a hiatal hernia (congenital), or it may develop over time. In almost all cases of hiatal hernia, only the top part of the stomach pushes through.   Many people have a hiatal hernia with no symptoms. The larger the hernia, the more likely that you will have symptoms. In some cases, a hiatal hernia allows stomach acid to flow back into the tube that carries food from your mouth to your stomach (esophagus). This may cause heartburn symptoms. Severe heartburn symptoms may mean you have developed a condition called gastroesophageal reflux disease (GERD).   CAUSES   Hiatal hernias are caused by a weakness in the opening (hiatus) where your esophagus passes through your diaphragm to attach to the upper part of your stomach. You may be born with a weakness in your hiatus, or a weakness can develop.  RISK FACTORS  Older age is a major risk factor for a hiatal hernia. Anything that increases pressure on your diaphragm can also increase your risk of a hiatal hernia. This includes:  · Pregnancy.  · Excess weight.  · Frequent constipation.  SIGNS AND SYMPTOMS   People with a hiatal hernia often have no symptoms. If symptoms develop, they are almost always caused by GERD. They may include:  · Heartburn.  · Belching.  · Indigestion.  · Trouble swallowing.  · Coughing or wheezing.   · Sore throat.  · Hoarseness.  · Chest pain.  DIAGNOSIS   A hiatal hernia is sometimes found during an exam for another problem. Your health care provider may suspect a hiatal hernia if you have symptoms of GERD. Tests may be done to diagnose GERD. These may include:  · X-rays of your stomach or chest.  · An upper gastrointestinal (GI) series. This is an X-ray exam of your GI tract involving the use of a chalky liquid that you swallow. The liquid shows up clearly on the X-ray.  · Endoscopy.  This is a procedure to look into your stomach using a thin, flexible tube that has a tiny camera and light on the end of it.  TREATMENT   If you have no symptoms, you may not need treatment. If you have symptoms, treatment may include:  · Dietary and lifestyle changes to help reduce GERD symptoms.  · Medicines. These may include:    Over-the-counter antacids.    Medicines that make your stomach empty more quickly.    Medicines that block the production of stomach acid (H2 blockers).    Stronger medicines to reduce stomach acid (proton pump inhibitors).  · You may need surgery to repair the hernia if other treatments are not helping.  HOME CARE INSTRUCTIONS   · Take all medicines as directed by your health care provider.  · Quit smoking, if you smoke.  · Try to achieve and maintain a healthy body weight.  · Eat frequent small meals instead of three large meals a day. This keeps your stomach from getting too full.    Eat slowly.    Do not lie down right after eating.     Do not eat 1-2 hours before bed.    · Do not drink beverages with caffeine. These include cola, coffee, cocoa, and tea.  · Do not drink alcohol.  · Avoid foods that can make symptoms of GERD worse. These may include:    Fatty foods.      Citrus fruits.    Other foods and drinks that contain acid.  · Avoid putting pressure on your belly. Anything that puts pressure on your belly increases the amount of acid that may be pushed up into your esophagus.      Avoid bending over, especially after eating.    Raise the head of your bed by putting blocks under the legs. This keeps your head and esophagus higher than your stomach.    Do not wear tight clothing around your chest or stomach.    Try not to strain when having a bowel movement, when urinating, or when lifting heavy objects.  SEEK MEDICAL CARE IF:  · Your symptoms are not controlled with medicines or lifestyle changes.  · You are having trouble swallowing.  · You have coughing or wheezing that will not  go away.  SEEK IMMEDIATE MEDICAL CARE IF:  · Your pain is getting worse.  · Your pain spreads to your arms, neck, jaw, teeth, or back.  · You have shortness of breath.  · You sweat for no reason.  · You feel sick to your stomach (nauseous) or vomit.  · You vomit blood.  · You have bright red blood in your stools.  · You have black, tarry stools.       This information is not intended to replace advice given to you by your health care provider. Make sure you discuss any questions you have with your health care provider.     Document Released: 11/29/2003 Document Revised: 09/29/2014 Document Reviewed: 08/26/2013  Elsevier Interactive Patient Education ©2016 Elsevier Inc.

## 2016-03-31 ENCOUNTER — Other Ambulatory Visit: Payer: Self-pay | Admitting: *Deleted

## 2016-03-31 DIAGNOSIS — K219 Gastro-esophageal reflux disease without esophagitis: Secondary | ICD-10-CM

## 2016-03-31 MED ORDER — OMEPRAZOLE 40 MG PO CPDR: 40.0000 mg | DELAYED_RELEASE_CAPSULE | Freq: Every day | ORAL | Status: DC

## 2016-03-31 NOTE — Telephone Encounter (Signed)
Omeprazole refilled.

## 2016-04-30 ENCOUNTER — Other Ambulatory Visit: Payer: Self-pay | Admitting: *Deleted

## 2016-04-30 DIAGNOSIS — K219 Gastro-esophageal reflux disease without esophagitis: Secondary | ICD-10-CM

## 2016-04-30 MED ORDER — OMEPRAZOLE 40 MG PO CPDR: 40.0000 mg | DELAYED_RELEASE_CAPSULE | Freq: Every day | ORAL | 3 refills | Status: DC

## 2016-04-30 NOTE — Telephone Encounter (Signed)
[facility] OR  RF request for omperazole - requesting 90 day supply LOV: 12/20/15 Next ov: None Last written: 03/31/16 #30 w/ 3RF

## 2016-05-07 DIAGNOSIS — K219 Gastro-esophageal reflux disease without esophagitis: Secondary | ICD-10-CM | POA: Insufficient documentation

## 2016-05-07 DIAGNOSIS — K2900 Acute gastritis without bleeding: Secondary | ICD-10-CM | POA: Insufficient documentation

## 2016-08-19 ENCOUNTER — Other Ambulatory Visit: Payer: Self-pay | Admitting: Family Medicine

## 2016-08-19 DIAGNOSIS — Z0000 Encounter for general adult medical examination without abnormal findings: Secondary | ICD-10-CM

## 2016-08-19 DIAGNOSIS — Z8042 Family history of malignant neoplasm of prostate: Secondary | ICD-10-CM

## 2016-08-19 DIAGNOSIS — Z125 Encounter for screening for malignant neoplasm of prostate: Secondary | ICD-10-CM

## 2016-08-27 ENCOUNTER — Other Ambulatory Visit: Payer: Managed Care, Other (non HMO)

## 2016-09-02 ENCOUNTER — Encounter: Payer: Managed Care, Other (non HMO) | Admitting: Family Medicine

## 2016-09-04 NOTE — Progress Notes (Signed)
Pre visit review using our clinic review tool, if applicable. No additional management support is needed unless otherwise documented below in the visit note. 

## 2016-09-05 ENCOUNTER — Ambulatory Visit (INDEPENDENT_AMBULATORY_CARE_PROVIDER_SITE_OTHER): Payer: Managed Care, Other (non HMO) | Admitting: Family Medicine

## 2016-09-05 ENCOUNTER — Encounter: Payer: Self-pay | Admitting: Family Medicine

## 2016-09-05 ENCOUNTER — Other Ambulatory Visit: Payer: Managed Care, Other (non HMO)

## 2016-09-05 VITALS — BP 134/80 | HR 77 | Temp 98.5°F | Resp 16 | Ht 74.0 in | Wt 355.0 lb

## 2016-09-05 DIAGNOSIS — Z0000 Encounter for general adult medical examination without abnormal findings: Secondary | ICD-10-CM

## 2016-09-05 DIAGNOSIS — Z125 Encounter for screening for malignant neoplasm of prostate: Secondary | ICD-10-CM | POA: Diagnosis not present

## 2016-09-05 DIAGNOSIS — Z8042 Family history of malignant neoplasm of prostate: Secondary | ICD-10-CM | POA: Diagnosis not present

## 2016-09-05 LAB — COMPREHENSIVE METABOLIC PANEL
ALBUMIN: 4.3 g/dL (ref 3.5–5.2)
ALK PHOS: 76 U/L (ref 39–117)
ALT: 27 U/L (ref 0–53)
AST: 21 U/L (ref 0–37)
BILIRUBIN TOTAL: 0.5 mg/dL (ref 0.2–1.2)
BUN: 15 mg/dL (ref 6–23)
CALCIUM: 9.3 mg/dL (ref 8.4–10.5)
CO2: 28 mEq/L (ref 19–32)
Chloride: 104 mEq/L (ref 96–112)
Creatinine, Ser: 1.02 mg/dL (ref 0.40–1.50)
GFR: 85.87 mL/min (ref >60.00)
Glucose, Bld: 103 mg/dL — ABNORMAL HIGH (ref 70–99)
Potassium: 4.4 mEq/L (ref 3.5–5.1)
Sodium: 138 mEq/L (ref 135–145)
TOTAL PROTEIN: 6.9 g/dL (ref 6.0–8.3)

## 2016-09-05 LAB — LIPID PANEL
CHOL/HDL RATIO: 5
CHOLESTEROL: 186 mg/dL (ref 0–200)
HDL: 39.9 mg/dL (ref >39.00)
LDL CALC: 119 mg/dL — AB (ref 0–99)
NONHDL: 146.55
Triglycerides: 139 mg/dL (ref 0.0–149.0)
VLDL: 27.8 mg/dL (ref 0.0–40.0)

## 2016-09-05 LAB — CBC WITH DIFFERENTIAL/PLATELET
BASOS PCT: 1.5 % (ref 0.0–3.0)
Basophils Absolute: 0.1 10*3/uL (ref 0.0–0.1)
EOS PCT: 2.2 % (ref 0.0–5.0)
Eosinophils Absolute: 0.1 10*3/uL (ref 0.0–0.7)
HEMATOCRIT: 41.9 % (ref 39.0–52.0)
HEMOGLOBIN: 14.3 g/dL (ref 13.0–17.0)
LYMPHS PCT: 27 % (ref 12.0–46.0)
Lymphs Abs: 1.6 10*3/uL (ref 0.7–4.0)
MCHC: 34 g/dL (ref 30.0–36.0)
MCV: 90.9 fl (ref 78.0–100.0)
MONOS PCT: 8.3 % (ref 3.0–12.0)
Monocytes Absolute: 0.5 10*3/uL (ref 0.1–1.0)
Neutro Abs: 3.7 10*3/uL (ref 1.4–7.7)
Neutrophils Relative %: 61 % (ref 43.0–77.0)
Platelets: 312 10*3/uL (ref 150.0–400.0)
RBC: 4.61 Mil/uL (ref 4.22–5.81)
RDW: 13.2 % (ref 11.5–15.5)
WBC: 6 10*3/uL (ref 4.0–10.5)

## 2016-09-05 LAB — TSH: TSH: 1.93 u[IU]/mL (ref 0.35–4.50)

## 2016-09-05 LAB — PSA: PSA: 0.55 ng/mL (ref 0.10–4.00)

## 2016-09-05 NOTE — Progress Notes (Signed)
Office Note 09/05/2016  CC:  Chief Complaint  Patient presents with  . Annual Exam    Pt is fasting.    HPI:  Scott Norris is a 40 y.o. male who is here for annual health maintenance exam.  Exercise: walking in neighborhood some. Diet: not working on anything except portion size.   Past Medical History:  Diagnosis Date  . Family history of prostate cancer    Father  . GERD (gastroesophageal reflux disease)   . History of chest pain 09/2013   [facility]  Also dx'd with musculoskeletal CP, costochondritis, and GERD for c/o CP in the past.  ETT normal 06/2015.  Marland Kitchen. Hypertriglyceridemia    Trigs>1400 in 2012: lovaza rx'd  . Morbid obesity (HCC) 12/2014   BMI 49  . OSA on CPAP     Past Surgical History:  Procedure Laterality Date  . ETT  06/2015   Normal  . KNEE SURGERY Right    Arthroscopic  . LASIK  2007  . WRIST SURGERY     Fractured wrist; no hardware in wrist.    Family History  Problem Relation Age of Onset  . Prostate cancer Father [id number]  . Hypertension Father     Social History   Social History  . Marital status: Single    Spouse name: N/A  . Number of children: N/A  . Years of education: N/A   Occupational History  . Not on file.   Social History Main Topics  . Smoking status: Never Smoker  . Smokeless tobacco: Former NeurosurgeonUser    Types: Snuff  . Alcohol use 1.2 oz/week    2 Shots of liquor per week     Comment: 4-5 weekly  . Drug use: No  . Sexual activity: Not on file   Other Topics Concern  . Not on file   Social History Narrative   Married, 2 y/o son.   Occupation: Scientist, physiologicalproject manager for commercial construction.   College: [employer]: BS.  Also played football for [employer].   No tobacco.  Drinks 2 bourbon drinks 4-5 days a week.   Dips tobacco.             Outpatient Medications Prior to Visit  Medication Sig Dispense Refill  . omeprazole (PRILOSEC) 40 MG capsule Take 1 capsule (40 mg total) by mouth daily.  (Patient taking differently: Take 40 mg by mouth 2 (two) times daily. ) 90 capsule 3  . sucralfate (CARAFATE) 1 g tablet Take 1 tablet (1 g total) by mouth 4 (four) times daily -  with meals and at bedtime. (Patient not taking: Reported on 09/05/2016) 90 tablet 1   No facility-administered medications prior to visit.     Allergies  Allergen Reactions  . Naproxen     REACTION: GI upset    ROS Review of Systems  Constitutional: Negative for appetite change, chills, fatigue and fever.  HENT: Negative for congestion, dental problem, ear pain and sore throat.   Eyes: Negative for discharge, redness and visual disturbance.  Respiratory: Negative for cough, chest tightness, shortness of breath and wheezing.   Cardiovascular: Negative for chest pain, palpitations and leg swelling.  Gastrointestinal: Negative for abdominal pain, blood in stool, diarrhea, nausea and vomiting.  Genitourinary: Negative for difficulty urinating, dysuria, flank pain, frequency, hematuria and urgency.  Musculoskeletal: Negative for arthralgias, back pain, joint swelling, myalgias and neck stiffness.  Skin: Negative for pallor and rash.  Neurological: Negative for dizziness, speech  difficulty, weakness and headaches.  Hematological: Negative for adenopathy. Does not bruise/bleed easily.  Psychiatric/Behavioral: Negative for confusion and sleep disturbance. The patient is not nervous/anxious.     PE; Blood pressure 134/80, pulse 77, temperature 98.5 F (36.9 C), temperature source Oral, resp. rate 16, height 6\' 2"  (1.88 m), weight (!) 355 lb (161 kg), SpO2 96 %. Body mass index is 45.58 kg/m.  Gen: Alert, well appearing.  Patient is oriented to person, place, time, and situation. AFFECT: pleasant, lucid thought and speech. ENT: Ears: EACs clear, normal epithelium.  TMs with good light reflex and landmarks bilaterally.  Eyes: no injection, icteris, swelling, or exudate.  EOMI, PERRLA. Nose: no drainage or turbinate  edema/swelling.  No injection or focal lesion.  Mouth: lips without lesion/swelling.  Oral mucosa pink and moist.  Dentition intact and without obvious caries or gingival swelling.  Oropharynx without erythema, exudate, or swelling.  Neck: supple/nontender.  No LAD, mass, or TM.  Carotid pulses 2+ bilaterally, without bruits. CV: RRR, no m/r/g.   LUNGS: CTA bilat, nonlabored resps, good aeration in all lung fields. ABD: soft, NT, ND, BS normal.  No hepatospenomegaly or mass.  No bruits. EXT: no clubbing, cyanosis, or edema.  Musculoskeletal: no joint swelling, erythema, warmth, or tenderness.  ROM of all joints intact. Skin - no sores or suspicious lesions or rashes or color changes Rectal exam: negative without mass, lesions or tenderness, PROSTATE EXAM: smooth and symmetric without nodules or tenderness.   Pertinent labs:  Lab Results  Component Value Date   TSH 3.36 01/08/2015   Lab Results  Component Value Date   WBC 8.6 01/08/2015   HGB 15.4 01/08/2015   HCT 45.5 01/08/2015   MCV 91.7 01/08/2015   PLT 312.0 01/08/2015   Lab Results  Component Value Date   CREATININE 1.03 11/22/2015   BUN 13 11/22/2015   NA 135 11/22/2015   K 4.5 11/22/2015   CL 101 11/22/2015   CO2 25 11/22/2015   Lab Results  Component Value Date   ALT 42 11/22/2015   AST 30 11/22/2015   ALKPHOS 67 11/22/2015   BILITOT 0.7 11/22/2015   Lab Results  Component Value Date   CHOL 221 (H) 01/08/2015   Lab Results  Component Value Date   HDL 52.20 01/08/2015   Lab Results  Component Value Date   LDLCALC 101 (H) 01/07/2010   Lab Results  Component Value Date   TRIG 245.0 (H) 01/08/2015   Lab Results  Component Value Date   CHOLHDL 4 01/08/2015   Lab Results  Component Value Date   PSA 0.46 01/12/2015    ASSESSMENT AND PLAN:   Health maintenance exam: Reviewed age and gender appropriate health maintenance issues (prudent diet, regular exercise, health risks of tobacco and excessive  alcohol, use of seatbelts, fire alarms in home, use of sunscreen).  Also reviewed age and gender appropriate health screening as well as vaccine recommendations. Flu vaccine UTD, as is Tdap. Fasting HP labs today. For FH of prostate ca (father), will start his screening now: DRE today normal.  PSA drawn.  An After Visit Summary was printed and given to the patient.  FOLLOW UP:  Return in about 1 year (around 09/05/2017) for annual CPE (fasting).  Signed:  Santiago BumpersPhil Kerryann Allaire, MD           09/05/2016

## 2016-09-11 ENCOUNTER — Encounter: Payer: Managed Care, Other (non HMO) | Admitting: Family Medicine

## 2016-11-05 ENCOUNTER — Ambulatory Visit (INDEPENDENT_AMBULATORY_CARE_PROVIDER_SITE_OTHER): Payer: Managed Care, Other (non HMO) | Admitting: Family Medicine

## 2016-11-05 ENCOUNTER — Encounter: Payer: Self-pay | Admitting: Family Medicine

## 2016-11-05 ENCOUNTER — Telehealth: Payer: Self-pay | Admitting: Family Medicine

## 2016-11-05 VITALS — BP 128/76 | HR 79 | Temp 98.2°F | Resp 16 | Ht 74.0 in | Wt 357.2 lb

## 2016-11-05 DIAGNOSIS — R072 Precordial pain: Secondary | ICD-10-CM | POA: Diagnosis not present

## 2016-11-05 DIAGNOSIS — G8929 Other chronic pain: Secondary | ICD-10-CM

## 2016-11-05 DIAGNOSIS — K219 Gastro-esophageal reflux disease without esophagitis: Secondary | ICD-10-CM

## 2016-11-05 DIAGNOSIS — R079 Chest pain, unspecified: Secondary | ICD-10-CM

## 2016-11-05 DIAGNOSIS — R0789 Other chest pain: Secondary | ICD-10-CM | POA: Diagnosis not present

## 2016-11-05 NOTE — Progress Notes (Signed)
OFFICE VISIT  11/05/2016   CC:  Chief Complaint  Patient presents with  . Chest Pain    off and on x 2 years, has had several test but nothing has shown up on the test   HPI:    Patient is a 41 y.o.  male who presents accompanied by his wife for chronic chest pain. Reviewed recent [facility] ED note from 08/19/16: presented with CP. CBC, CMET all normal.  Trop T normal (<0.010).  CXR normal, EKG normal. D/c'd home with likely dx of musculoskeletal CP.  History:  About 2 yrs of recurring L chest discomfort, feels a tightening under left pectoral region, sometimes there 20 min, happens several times per day.  Occurs at rest primarily.  Says he can go up and down steps and has no symptoms.  The pain doesn't radiate, is not assoc with nausea or diaphoresis or SOB.  It does make him anxious when he has it.  It is not occurring more frequently lately than normal--he is simply getting more and more anxious about the possibility of this being from his heart and he is hoping for someone to give him a more definitive answer.  He has never seen a cardiologist for his CP or for any other reason. No definite relationship of his symptom to stress.  He notes it occurs less when he is busy and distracted.  Over the last 2 yrs he has had a neg ETT, normal CXR x 2, neg cardiac enzymes (at ED recently), and an EGD as part of his w/u for this CP.  His wife says the EGD did show signs of GERD and he has been on his omeprazole 40mg  bid.  He doesn't think taking the omeprazole has helped his L chest discomfort any, but he does recall having typical GER sx's prior to getting on the omeprazole.  His work is not physically strenuous.    ROS: no fever, no cough, no nausea/vomiting, no NSAID use, no dizziness, no fatigue.  Past Medical History:  Diagnosis Date  . Family history of prostate cancer    Father  . GERD (gastroesophageal reflux disease)   . History of chest pain 09/2013   Ruled out for ACS at [facility]  ED.  Also dx'd with musculoskeletal CP, costochondritis, and GERD for c/o CP in the past.  ETT normal 06/2015.  Marland Kitchen Hypertriglyceridemia    Trigs>1400 in 2012: lovaza rx'd  . Morbid obesity (HCC) 12/2014   BMI 49  . OSA on CPAP     Past Surgical History:  Procedure Laterality Date  . ESOPHAGOGASTRODUODENOSCOPY     Normal 12/2015 [facility] per pt.  Marland Kitchen ETT  06/2015   Normal  . KNEE SURGERY Right    Arthroscopic  . LASIK  2007  . WRIST SURGERY     Fractured wrist; no hardware in wrist.    Outpatient Medications Prior to Visit  Medication Sig Dispense Refill  . omeprazole (PRILOSEC) 40 MG capsule Take 1 capsule (40 mg total) by mouth daily. (Patient taking differently: Take 40 mg by mouth 2 (two) times daily. ) 90 capsule 3   No facility-administered medications prior to visit.     Allergies  Allergen Reactions  . Naproxen     REACTION: GI upset    ROS As per HPI  PE: Blood pressure 128/76, pulse 79, temperature 98.2 F (36.8 C), temperature source Oral, resp. rate 16, height 6\' 2"  (1.88 m), weight (!) 357 lb 4 oz (162 kg), SpO2 95 %.  Gen: Alert, well appearing, morbidly obese.  Patient is oriented to person, place, time, and situation. AFFECT: pleasant, lucid thought and speech. RUE:AVWUENT:Eyes: no injection, icteris, swelling, or exudate.  EOMI, PERRLA. Mouth: lips without lesion/swelling.  Oral mucosa pink and moist. Oropharynx without erythema, exudate, or swelling.  CV: RRR, no m/r/g.   LUNGS: CTA bilat, nonlabored resps, good aeration in all lung fields. Chest wall is completely without TTP.  No bruising or rash. ABD: soft, rotund, NT EXT: no clubbing, cyanosis, or edema.    LABS:  Lab Results  Component Value Date   TSH 1.93 09/05/2016   Lab Results  Component Value Date   WBC 6.0 09/05/2016   HGB 14.3 09/05/2016   HCT 41.9 09/05/2016   MCV 90.9 09/05/2016   PLT 312.0 09/05/2016   Lab Results  Component Value Date   CREATININE 1.02 09/05/2016   BUN 15 09/05/2016    NA 138 09/05/2016   K 4.4 09/05/2016   CL 104 09/05/2016   CO2 28 09/05/2016   Lab Results  Component Value Date   ALT 27 09/05/2016   AST 21 09/05/2016   ALKPHOS 76 09/05/2016   BILITOT 0.5 09/05/2016   Lab Results  Component Value Date   CHOL 186 09/05/2016   Lab Results  Component Value Date   HDL 39.90 09/05/2016   Lab Results  Component Value Date   LDLCALC 119 (H) 09/05/2016   Lab Results  Component Value Date   TRIG 139.0 09/05/2016   Lab Results  Component Value Date   CHOLHDL 5 09/05/2016   Lab Results  Component Value Date   PSA 0.55 09/05/2016   PSA 0.46 01/12/2015    IMPRESSION AND PLAN:  1) Chronic atypical chest pain; most features point towards this being musculoskeletal and/or anxiety related. However, will refer to cardiology to see if any further testing should be done at this time to more definitively exclude CAD---this is the patient's main desire currently. No new meds rx'd today.  2) GERD; discussed trial of ween off of PPI to see how he does. Instructions: Decrease your omeprazole to 40mg  once every morning x 1 month. Then decrease this to every other morning for 1 month, then stop this medication. Restart this medication if you feel your reflux symptoms return on a regular basis.  An After Visit Summary was printed and given to the patient.  FOLLOW UP: Return if symptoms worsen or fail to improve.  Signed:  Santiago BumpersPhil McGowen, MD           11/05/2016

## 2016-11-05 NOTE — Patient Instructions (Addendum)
Decrease your omeprazole to 40mg  once every morning x 1 month. Then decrease this to every other morning for 1 month, then stop this medication. Restart this medication if you feel your reflux symptoms return on a regular basis.

## 2016-11-05 NOTE — Telephone Encounter (Signed)
Patient wife called to request an appt. She states that the patient has been experiencing chest pain. Refused triage service. She states that this has been an ongoing issue for 2 years that that he just needs an appt. I scheduled the patient for 230 today, but wanted to get this information to you urgently.   I advised kelly to expect a call from us, as we may want to see him sooner or have further instructions for him to follow in the meantime.   Cell phone number is listed above.

## 2016-11-05 NOTE — Progress Notes (Signed)
Pre visit review using our clinic review tool, if applicable. No additional management support is needed unless otherwise documented below in the visit note. 

## 2016-11-05 NOTE — Telephone Encounter (Signed)
Noted  

## 2016-11-05 NOTE — Telephone Encounter (Signed)
FYI

## 2016-11-21 ENCOUNTER — Ambulatory Visit: Payer: Managed Care, Other (non HMO) | Admitting: Internal Medicine

## 2016-12-09 ENCOUNTER — Encounter: Payer: Self-pay | Admitting: Internal Medicine

## 2016-12-09 ENCOUNTER — Ambulatory Visit (INDEPENDENT_AMBULATORY_CARE_PROVIDER_SITE_OTHER): Payer: Managed Care, Other (non HMO) | Admitting: Internal Medicine

## 2016-12-09 VITALS — BP 130/88 | HR 84 | Ht 74.0 in | Wt 352.0 lb

## 2016-12-09 DIAGNOSIS — R072 Precordial pain: Secondary | ICD-10-CM

## 2016-12-09 DIAGNOSIS — Z8249 Family history of ischemic heart disease and other diseases of the circulatory system: Secondary | ICD-10-CM | POA: Diagnosis not present

## 2016-12-09 DIAGNOSIS — R079 Chest pain, unspecified: Secondary | ICD-10-CM | POA: Diagnosis not present

## 2016-12-09 NOTE — Progress Notes (Signed)
OFFICE NOTE  Chief Complaint:  Chest pain  Primary Care Physician: Jeoffrey Massed, MD  HPI:  Scott Norris is a 41 y.o. 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 He reports he's had chest pain on and off for about a year. Recently it somewhat worse. 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 He says he gets pain both at rest and sometimes with exertion but generally can exercise without significant limitations. Interestingly he's had pain mostly in the left anterior chest. He says it feels like it's below left breast and somewhat sharp. He says it does not hurt worse with palpation or is not necessarily exacerbated with movement or twisting. He does have a chronic left rotator cuff problem. He underwent an exercise treadmill stress test in 2014 which was negative for ischemia. He also had a chest CT scan at [facility] last year which was actually ordered for left upper quadrant abdominal pain. He had workup for biliary disease which was negative. Unfortunately the CT scan did not comment on the coronaries and was not designed to look at the coronaries. The indication was for biliary dyskinesia, however the indication was left upper quadrant pain (which is strange since the  gallbladder is in the right upper quadrant). EKG shows no ischemic changes. It is noted on the chest CT scan from October last year at there is mild multilevel degenerative changes of the thoracic spine.? Thoracic radiculopathy  PMHx:  Past Medical History:  Diagnosis Date  . Family history of prostate cancer    Father  . GERD (gastroesophageal reflux disease)   . History of chest pain 09/2013   Ruled out for ACS at [facility] ED.  Also dx'd with musculoskeletal CP, costochondritis, and GERD for c/o CP in the past.  ETT normal 06/2015.  Marland Kitchen Hypertriglyceridemia    Trigs>1400 in 2012: lovaza rx'd  . Morbid obesity (HCC) 12/2014   BMI 49  . OSA on CPAP     Past  Surgical History:  Procedure Laterality Date  . ESOPHAGOGASTRODUODENOSCOPY     Normal 12/2015 [facility] per pt.  Marland Kitchen ETT  06/2015   Normal  . KNEE SURGERY Right    Arthroscopic  . LASIK  2007  . WRIST SURGERY     Fractured wrist; no hardware in wrist.    FAMHx:  Family History  Problem Relation Age of Onset  . Prostate cancer Father 64  . Hypertension Father     SOCHx:   reports that he has never smoked. He has quit using smokeless tobacco. His smokeless tobacco use included Snuff. He reports that he drinks about 1.2 oz of alcohol per week . He reports that he does not use drugs.  ALLERGIES:  Allergies  Allergen Reactions  . Naproxen     REACTION: GI upset    ROS: Pertinent items noted in HPI and remainder of comprehensive ROS otherwise negative.  HOME MEDS: Current Outpatient Prescriptions on File Prior to Visit  Medication Sig Dispense Refill  . omeprazole (PRILOSEC) 40 MG capsule Take 1 capsule (40 mg total) by mouth daily. (Patient taking differently: Take 40 mg by mouth 2 (two) times daily. ) 90 capsule 3   No current facility-administered medications on file prior to visit.     LABS/IMAGING: No results found for this or any previous visit (from the past 48 hour(s)). No results found.  WEIGHTS: Wt Readings from Last 3 Encounters:  12/09/16 (!) 352 lb (159.7  kg)  11/05/16 (!) 357 lb 4 oz (162 kg)  09/05/16 (!) 355 lb (161 kg)    VITALS: BP 130/88   Pulse 84   Ht 6\' 2"  (1.88 m)   Wt (!) 352 lb (159.7 kg)   BMI 45.19 kg/m   EXAM: General appearance: alert and no distress Neck: no carotid bruit and no JVD Lungs: clear to auscultation bilaterally Heart: regular rate and rhythm Abdomen: soft, non-tender; bowel sounds normal; no masses,  no organomegaly Extremities: extremities normal, atraumatic, no cyanosis or edema Pulses: 2+ and symmetric Skin: Skin color, texture, turgor normal. No rashes or lesions Neurologic: Alert and oriented X 3, normal strength  and tone. Normal symmetric reflexes. Normal coordination and gait Psych: Pleasant  EKG: Sinus rhythm at 84  ASSESSMENT: 1. Atypical chest pain-suspect a thoracic radiculopathy or intercostal pain 2. Family history of heart disease in father and grandmother. 3. GERD-treated  PLAN: 1.   Scott Norris has persistent left-sided chest pain without any clear biliary source (he had a negative HIDA scan and abdominal ultrasound with only mild steatohepatitis). Treatment for GERD did not improve his symptoms. It has recently been somewhat worse. I suspect this could be a chest wall pain or perhaps radiculopathy. The pain is sometimes worse with movement but is generally present at times during rest and after working throughout the day, although he does not do a very physical job. The pain seems to be persisting and I think he needs a definitive coronary evaluation. I would recommend a CT coronary angiogram. If this is negative then no further cardiac testing is necessary and I would possibly evaluate the thoracic spine with MRI to help find a possible cause.  Thanks for the kind referral.  Scott NoseKenneth C. Hilty, MD, [facility] CenterFACC Attending Cardiologist [facility]  Scott Norris 12/09/2016, 5:18 PM

## 2016-12-09 NOTE — Patient Instructions (Signed)
Your physician has requested that you have cardiac CT. Cardiac computed tomography (CT) is a painless test that uses an x-ray machine to take clear, detailed pictures of your heart. For further information please visit www.cardiosmart.org. Please follow instruction sheet as given.   

## 2016-12-18 ENCOUNTER — Other Ambulatory Visit: Payer: Self-pay | Admitting: *Deleted

## 2016-12-18 ENCOUNTER — Telehealth: Payer: Self-pay | Admitting: Internal Medicine

## 2016-12-18 DIAGNOSIS — R0789 Other chest pain: Secondary | ICD-10-CM

## 2016-12-18 NOTE — Telephone Encounter (Signed)
Called patient to schedule 2 day lexiscan myoview.   Left my name and number to call me back.

## 2016-12-18 NOTE — Telephone Encounter (Signed)
LMTCB  Insurance has denied cardiac CTA - patient needs exercise myoview instead, per Dr.Hilty  This has been ordered  Left message to make patient aware of testing change

## 2016-12-19 NOTE — Telephone Encounter (Signed)
Patient aware. Scheduled for myoview 4/10 and 4/11

## 2016-12-25 ENCOUNTER — Telehealth (HOSPITAL_COMMUNITY): Payer: Self-pay | Admitting: Radiology

## 2016-12-25 ENCOUNTER — Telehealth (HOSPITAL_COMMUNITY): Payer: Self-pay

## 2016-12-25 NOTE — Telephone Encounter (Signed)
Encounter complete. 

## 2016-12-25 NOTE — Telephone Encounter (Signed)
Patient given detailed instructions per Myocardial Perfusion Study Information Sheet for the test on 4/17 and 4/18 at 7:45. Patient notified to arrive 15 minutes early and that it is imperative to arrive on time for appointment to keep from having the test rescheduled.  If you need to cancel or reschedule your appointment, please call the office within 24 hours of your appointment. Failure to do so may result in a cancellation of your appointment, and a $50 no show fee. Patient verbalized understanding.EHK

## 2016-12-30 ENCOUNTER — Inpatient Hospital Stay (HOSPITAL_COMMUNITY): Admission: RE | Admit: 2016-12-30 | Payer: Managed Care, Other (non HMO) | Source: Ambulatory Visit

## 2016-12-31 ENCOUNTER — Inpatient Hospital Stay (HOSPITAL_COMMUNITY): Admission: RE | Admit: 2016-12-31 | Payer: Managed Care, Other (non HMO) | Source: Ambulatory Visit

## 2017-01-01 ENCOUNTER — Telehealth (HOSPITAL_COMMUNITY): Payer: Self-pay

## 2017-01-01 NOTE — Telephone Encounter (Signed)
Encounter complete. 

## 2017-01-06 ENCOUNTER — Ambulatory Visit (HOSPITAL_COMMUNITY)
Admission: RE | Admit: 2017-01-06 | Discharge: 2017-01-06 | Disposition: A | Discharge status: Home / Self Care | Payer: Managed Care, Other (non HMO) | Source: Ambulatory Visit | Attending: Cardiology | Admitting: Cardiology

## 2017-01-06 DIAGNOSIS — G4733 Obstructive sleep apnea (adult) (pediatric): Secondary | ICD-10-CM | POA: Diagnosis not present

## 2017-01-06 DIAGNOSIS — E669 Obesity, unspecified: Secondary | ICD-10-CM | POA: Diagnosis not present

## 2017-01-06 DIAGNOSIS — R0789 Other chest pain: Secondary | ICD-10-CM | POA: Diagnosis not present

## 2017-01-06 DIAGNOSIS — Z8249 Family history of ischemic heart disease and other diseases of the circulatory system: Secondary | ICD-10-CM | POA: Diagnosis not present

## 2017-01-06 MED ORDER — TECHNETIUM TC 99M TETROFOSMIN IV KIT
30.9000 | PACK | Freq: Once | INTRAVENOUS | Status: AC | PRN
  Administered 2017-01-06: 30.9 via INTRAVENOUS
  Filled 2017-01-06: qty 31

## 2017-01-07 ENCOUNTER — Ambulatory Visit (HOSPITAL_COMMUNITY)
Admission: RE | Admit: 2017-01-07 | Discharge: 2017-01-07 | Disposition: A | Discharge status: Home / Self Care | Payer: Managed Care, Other (non HMO) | Source: Ambulatory Visit | Attending: Cardiovascular Disease | Admitting: Cardiovascular Disease

## 2017-01-07 LAB — MYOCARDIAL PERFUSION IMAGING
CHL CUP MPHR: 180 {beats}/min
CHL CUP NUCLEAR SDS: 4
CHL CUP NUCLEAR SSS: 6
CHL RATE OF PERCEIVED EXERTION: 18
CSEPED: 10 min
CSEPEDS: 21 s
CSEPHR: 88 %
Estimated workload: 10.9 METS
LV dias vol: 183 mL (ref 62–150)
LVSYSVOL: 83 mL
NUC STRESS TID: 0.8
Peak HR: 160 {beats}/min
Rest HR: 85 {beats}/min
SRS: 2

## 2017-01-07 MED ORDER — TECHNETIUM TC 99M TETROFOSMIN IV KIT
28.0000 | PACK | Freq: Once | INTRAVENOUS | Status: AC | PRN
  Administered 2017-01-07: 28 via INTRAVENOUS

## 2017-01-28 ENCOUNTER — Ambulatory Visit (INDEPENDENT_AMBULATORY_CARE_PROVIDER_SITE_OTHER): Payer: Managed Care, Other (non HMO) | Admitting: Family Medicine

## 2017-01-28 ENCOUNTER — Encounter: Payer: Self-pay | Admitting: Family Medicine

## 2017-01-28 VITALS — BP 112/79 | HR 89 | Temp 98.2°F | Resp 16 | Ht 74.0 in | Wt 352.8 lb

## 2017-01-28 DIAGNOSIS — G5702 Lesion of sciatic nerve, left lower limb: Secondary | ICD-10-CM

## 2017-01-28 MED ORDER — PREDNISONE 20 MG PO TABS: ORAL_TABLET | ORAL | 0 refills | Status: DC

## 2017-01-28 NOTE — Progress Notes (Addendum)
OFFICE VISIT  01/28/2017   CC:  Chief Complaint  Patient presents with  . Leg Pain    left, off and on x 1-2 months   HPI:    Patient is a 41 y.o.  male who presents for left leg pain. Onset of L leg pain intermittently about 2 mo ago.  Starts in middle of hamstring and radiates down to back of distal hamstring and up to L gluteal region.  Made worse by getting up from sitting and from moving L leg into certain positions.  The pain is sharp, shooting.  NOt a pulling achy pain.  Doesn't recall any prior strain or trauma. No paresthesias.  No weakness.  Takes advil when it flares (800 mg) and this helps some. Lasts a few days, then goes away completely for a week, sometimes 2.  Past Medical History:  Diagnosis Date  . Family history of prostate cancer    Father  . GERD (gastroesophageal reflux disease)   . History of chest pain 09/2013   [facility].  Also dx'd with musculoskeletal CP, costochondritis, and GERD for c/o CP in the past.  ETT normal 06/2015.  Dr. Rennis GoldenHilty eval 11/2016--plan for cardiac CT  . Hypertriglyceridemia    Trigs>1400 in 2012: lovaza rx'd  . Morbid obesity (HCC) 12/2014   BMI 49  . OSA on CPAP     Past Surgical History:  Procedure Laterality Date  . ESOPHAGOGASTRODUODENOSCOPY     Normal 12/2015 [facility] per pt.  Marland Kitchen. ETT  06/2015   Normal  . KNEE SURGERY Right    Arthroscopic  . LASIK  2007  . WRIST SURGERY     Fractured wrist; no hardware in wrist.   MEDS: none  Allergies  Allergen Reactions  . Naproxen     REACTION: GI upset    ROS As per HPI  PE: Blood pressure 112/79, pulse 89, temperature 98.2 F (36.8 C), temperature source Oral, resp. rate 16, height 6\' 2"  (1.88 m), weight (!) 352 lb 12 oz (160 kg), SpO2 95 %. Body mass index is 45.29 kg/m. Gen: Alert, well appearing.  Patient is oriented to person, place, time, and situation. AFFECT: pleasant, lucid thought and speech. ROM of L spine: intact but painful in flexion. TTP over  ischial tuberosity region on L. Sitting SLR neg on R, but elicits L glut/hamstring pain on L at 45 deg. LE strength 5/5 prox/dist bilat. DTRs symmetric. FABER: elicits no pain on R, mild discomfort in L mid glut region. No SI joint pain.  LABS:    Chemistry      Component Value Date/Time   NA 138 09/05/2016 0853   K 4.4 09/05/2016 0853   CL 104 09/05/2016 0853   CO2 28 09/05/2016 0853   BUN 15 09/05/2016 0853   CREATININE 1.02 09/05/2016 0853      Component Value Date/Time   CALCIUM 9.3 09/05/2016 0853   ALKPHOS 76 09/05/2016 0853   AST 21 09/05/2016 0853   ALT 27 09/05/2016 0853   BILITOT 0.5 09/05/2016 0853       IMPRESSION AND PLAN:  Left leg sciatica--intermittent.  Piriformis syndrome? Less likely pulled hamstring/glut. Stretching, heat, prednisone 40mg  qd x 5d.  Then take ibup 800 mg bid with food for 5 more days. If not significantly improved over the next 10d, then next step is physical therapy.  An After Visit Summary was printed and given to the patient.  FOLLOW UP: Return if symptoms worsen or  fail to improve.  Signed:  Santiago Bumpers, MD           01/28/2017

## 2017-01-28 NOTE — Patient Instructions (Signed)
Apply heat to the area of pain for 20 minutes 1-2 times per day.

## 2017-02-17 ENCOUNTER — Encounter: Payer: Self-pay | Admitting: Family Medicine

## 2017-02-18 NOTE — Telephone Encounter (Signed)
Left message for pt to call back  °

## 2017-03-04 NOTE — Telephone Encounter (Signed)
Received notification that mychart message was not read.   Left message on cell vm for pt to check his mychart or call our office.

## 2017-05-08 ENCOUNTER — Encounter: Payer: Self-pay | Admitting: Family Medicine

## 2017-05-08 ENCOUNTER — Other Ambulatory Visit: Payer: Self-pay | Admitting: Family Medicine

## 2017-05-08 ENCOUNTER — Ambulatory Visit (HOSPITAL_BASED_OUTPATIENT_CLINIC_OR_DEPARTMENT_OTHER)
Admission: RE | Admit: 2017-05-08 | Discharge: 2017-05-08 | Disposition: A | Discharge status: Home / Self Care | Payer: Managed Care, Other (non HMO) | Source: Ambulatory Visit | Attending: Family Medicine | Admitting: Family Medicine

## 2017-05-08 ENCOUNTER — Ambulatory Visit (INDEPENDENT_AMBULATORY_CARE_PROVIDER_SITE_OTHER): Payer: Managed Care, Other (non HMO) | Admitting: Family Medicine

## 2017-05-08 VITALS — BP 118/75 | HR 89 | Temp 98.5°F | Resp 16 | Ht 74.0 in | Wt 365.5 lb

## 2017-05-08 DIAGNOSIS — R0789 Other chest pain: Secondary | ICD-10-CM

## 2017-05-08 DIAGNOSIS — R229 Localized swelling, mass and lump, unspecified: Secondary | ICD-10-CM

## 2017-05-08 DIAGNOSIS — R222 Localized swelling, mass and lump, trunk: Secondary | ICD-10-CM | POA: Diagnosis not present

## 2017-05-08 NOTE — Progress Notes (Signed)
OFFICE VISIT  05/08/2017   CC:  Chief Complaint  Patient presents with  . Collar bone swollen    left   HPI:    Patient is a 41 y.o.  male who presents for "swollen collarbone". Left side.  Onset "ages ago", but last couple weeks has been more persistently swollen, and painful to touch. Redness noted quite a bit last evening but he admits he was rubbing on it quite a bit yesterday.  Felt a lot better this morning and the redness was gone.  Pain got better with use of 800 mg ibuprofen last evening. Movements of L shoulder exacerbate the pain.  No injury recalled.  Usually only comes for a short period w/out much swelling and then stays only 1-2d and goes away for months at a time.  No fever/chills/malaise.   Past Medical History:  Diagnosis Date  . Family history of prostate cancer    Father  . GERD (gastroesophageal reflux disease)   . History of chest pain 09/2013   Ruled out for ACS at [facility] ED.  Also dx'd with musculoskeletal CP, costochondritis, and GERD for c/o CP in the past.  ETT normal 06/2015.  Dr. Rennis Golden eval 11/2016--plan for cardiac CT  . Hypertriglyceridemia    Trigs>1400 in 2012: lovaza rx'd  . Morbid obesity (HCC) 12/2014   BMI 49  . OSA on CPAP     Past Surgical History:  Procedure Laterality Date  . ESOPHAGOGASTRODUODENOSCOPY     Normal 12/2015 [facility] per pt.  Marland Kitchen ETT  06/2015   Normal  . KNEE SURGERY Right    Arthroscopic  . LASIK  2007  . WRIST SURGERY     Fractured wrist; no hardware in wrist.    Outpatient Medications Prior to Visit  Medication Sig Dispense Refill  . cetirizine (ZYRTEC) 10 MG chewable tablet Chew 10 mg by mouth daily.    . predniSONE (DELTASONE) 20 MG tablet 2 tabs po qd x 5d (Patient not taking: Reported on 05/08/2017) 10 tablet 0   No facility-administered medications prior to visit.     Allergies  Allergen Reactions  . Naproxen     REACTION: GI upset    ROS As per HPI  PE: Blood pressure 118/75, pulse 89, temperature 98.5  F (36.9 C), temperature source Oral, resp. rate 16, height 6\' 2"  (1.88 m), weight (!) 365 lb 8 oz (165.8 kg), SpO2 96 %. Gen: Alert, well appearing.  Patient is oriented to person, place, time, and situation. AFFECT: pleasant, lucid thought and speech. OVZ:CHYI: no injection, icteris, swelling, or exudate.  EOMI, PERRLA. Mouth: lips without lesion/swelling.  Oral mucosa pink and moist. Oropharynx without erythema, exudate, or swelling.  CV: RRR, no m/r/g.   LUNGS: CTA bilat, nonlabored resps, good aeration in all lung fields. Palpable soft tissue swelling just inferolateral to the sternoclavicular junction--about 3 cm oval shaped.  Soft, mildly fluctuant, seems a bit deeper than subcutaneous.  No induration, erythema, or fixation to surrounding tissues.  Does not feel attached to the clavicle or sternum.  The area of swelling is moderately tender.  LABS:  none  IMPRESSION AND PLAN:  Left chest wall soft tissue swelling; recurrent but worse now than ever before. Suspicious for cyst vs solid mass such as lymph node.  Less likely is ectopic thyroid tissue. Abscess less likely. Will further evaluate with chest ultrasound. If cystic, discussed warm compress application to the area for 30 min 1-2 times per day. Signs/symptoms to call or return for were  reviewed and pt expressed understanding.  An After Visit Summary was printed and given to the patient.  FOLLOW UP: Return for to be determined based on result of imaging.  Signed:  Santiago Bumpers, MD           05/08/2017

## 2017-05-23 DIAGNOSIS — M25512 Pain in left shoulder: Secondary | ICD-10-CM

## 2017-05-23 HISTORY — DX: Pain in left shoulder: M25.512

## 2017-05-29 ENCOUNTER — Ambulatory Visit (HOSPITAL_BASED_OUTPATIENT_CLINIC_OR_DEPARTMENT_OTHER)
Admission: RE | Admit: 2017-05-29 | Discharge: 2017-05-29 | Disposition: A | Discharge status: Home / Self Care | Payer: Managed Care, Other (non HMO) | Source: Ambulatory Visit | Attending: Family Medicine | Admitting: Family Medicine

## 2017-05-29 ENCOUNTER — Encounter: Payer: Self-pay | Admitting: Family Medicine

## 2017-05-29 ENCOUNTER — Ambulatory Visit (INDEPENDENT_AMBULATORY_CARE_PROVIDER_SITE_OTHER): Payer: Managed Care, Other (non HMO) | Admitting: Family Medicine

## 2017-05-29 ENCOUNTER — Other Ambulatory Visit: Payer: Self-pay | Admitting: Family Medicine

## 2017-05-29 VITALS — BP 128/76 | HR 89 | Temp 98.5°F | Resp 16 | Ht 74.0 in | Wt 356.0 lb

## 2017-05-29 DIAGNOSIS — M898X1 Other specified disorders of bone, shoulder: Secondary | ICD-10-CM

## 2017-05-29 DIAGNOSIS — M25512 Pain in left shoulder: Secondary | ICD-10-CM

## 2017-05-29 DIAGNOSIS — R222 Localized swelling, mass and lump, trunk: Secondary | ICD-10-CM | POA: Diagnosis not present

## 2017-05-29 LAB — BASIC METABOLIC PANEL
BUN: 17 mg/dL (ref 6–23)
CALCIUM: 9.6 mg/dL (ref 8.4–10.5)
CHLORIDE: 102 meq/L (ref 96–112)
CO2: 26 mEq/L (ref 19–32)
CREATININE: 1.08 mg/dL (ref 0.40–1.50)
GFR: 80.1 mL/min (ref >60.00)
Glucose, Bld: 108 mg/dL — ABNORMAL HIGH (ref 70–99)
Potassium: 4.7 mEq/L (ref 3.5–5.1)
Sodium: 137 mEq/L (ref 135–145)

## 2017-05-29 NOTE — Progress Notes (Signed)
OFFICE VISIT  05/29/2017   CC:  Chief Complaint  Patient presents with  . Collar Bone still painful   HPI:    Patient is a 41 y.o. Caucasian male who presents for "collar bone still painful". I saw him for this problem 3 weeks ago, felt a soft tissue mass inferiorolateral to sternoclavicular junction (left side).  I did chest u/s which showed no discrete mass or fluid collection.  It was recommended by the reading radiologist that if pain persists despite conservative therapy, MRI may be warranted for further characterization.  The pain and nodule/swelling went away a couple days after I saw him.  Then he woke up 2 d/a with the area swollen and hurting again.   Question of sleeping with neck/shoulder in odd position. The pain radiated slightly out into L shoulder area.   The pain has persisted but is not as bad or swollen as 2 d/a.  Worse pain when turns his head to R. He has felt well otherwise.  No f/c/malaise.  Advil occasionally taken--mild improvement briefly.  He has had purposeful wt loss of 9 lbs since last visit: dieting.  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 It is odd that it seems to have gone away after last visit and then came back 2 d/a.   Past Medical History:  Diagnosis Date  . Family history of prostate cancer    Father  . GERD (gastroesophageal reflux disease)   . History of chest pain 09/2013   Ruled out for ACS at [facility] ED.  Also dx'd with musculoskeletal CP, costochondritis, and GERD for c/o CP in the past.  ETT normal 06/2015.  Dr. Rennis Golden eval 11/2016--plan for cardiac CT  . Hypertriglyceridemia    Trigs>1400 in 2012: lovaza rx'd  . Morbid obesity (HCC) 12/2014   BMI 49  . OSA on CPAP     Past Surgical History:  Procedure Laterality Date  . ESOPHAGOGASTRODUODENOSCOPY     Normal 12/2015 [facility] per pt.  Marland Kitchen ETT  06/2015   Normal  . KNEE SURGERY Right    Arthroscopic  . LASIK  2007  . WRIST SURGERY      Fractured wrist; no hardware in wrist.    Outpatient Medications Prior to Visit  Medication Sig Dispense Refill  . cetirizine (ZYRTEC) 10 MG chewable tablet Chew 10 mg by mouth daily.     No facility-administered medications prior to visit.     Allergies  Allergen Reactions  . Naproxen     REACTION: GI upset    ROS As per HPI  PE: Blood pressure 128/76, pulse 89, temperature 98.5 F (36.9 C), temperature source Oral, resp. rate 16, height  (1.88 m), weight (!) 356 lb (161.5 kg), SpO2 94 %. Gen: Alert, well appearing.  Patient is oriented to person, place, time, and situation. AFFECT: pleasant, lucid thought and speech. Left supraclavicular area with slight fullness on inspection compared to right---but no mass or induration is palpable.  Tender to palpation over proximal left clavical and at L sternoclavicular junction.  Just inferolateral to sternoclavicular junction, there is a soft tissue mass palbable that is slightly rubbery feeling, not indurated or erythematous, not hard or fixed to surrounding tissues.  This is mildly tender to palpation as well.    LABS:    Chemistry      Component Value Date/Time   NA 138 09/05/2016 0853   K 4.4 09/05/2016 0853   CL 104  09/05/2016 0853   CO2 28 09/05/2016 0853   BUN 15 09/05/2016 0853   CREATININE 1.02 09/05/2016 0853      Component Value Date/Time   CALCIUM 9.3 09/05/2016 0853   ALKPHOS 76 09/05/2016 0853   AST 21 09/05/2016 0853   ALT 27 09/05/2016 0853   BILITOT 0.5 09/05/2016 0853      IMPRESSION AND PLAN:  Left clavical/sternoclavicular junction pain/tenderness, with marble sized soft tissue mass palpable just inferolateral to [place] junction on left.  Mild fullness of L supraclavicular fossa, without palpable mass. Unknown etiology. Chest u/s 3 weeks ago showed no abnormality. Will check left [place] junction/clavicle x-ray today.  If this is normal, will proceed with MRI w and w/out contrast as per radiologist's  recommendations (see HPI above). BMET today for renal function in prep for possible MRI contrast.  An After Visit Summary was printed and given to the patient.  FOLLOW UP: Return for f/u to be determined based on results of w/u.  Signed:  Santiago BumpersPhil McGowen, MD           05/29/2017

## 2017-06-06 ENCOUNTER — Encounter (HOSPITAL_COMMUNITY): Payer: Self-pay | Admitting: Emergency Medicine

## 2017-06-06 ENCOUNTER — Telehealth: Payer: Self-pay | Admitting: Family Medicine

## 2017-06-06 ENCOUNTER — Ambulatory Visit (HOSPITAL_BASED_OUTPATIENT_CLINIC_OR_DEPARTMENT_OTHER)
Admission: RE | Admit: 2017-06-06 | Discharge: 2017-06-06 | Disposition: A | Discharge status: Home / Self Care | Payer: Managed Care, Other (non HMO) | Source: Ambulatory Visit | Attending: Family Medicine | Admitting: Family Medicine

## 2017-06-06 ENCOUNTER — Observation Stay (HOSPITAL_BASED_OUTPATIENT_CLINIC_OR_DEPARTMENT_OTHER)
Admission: EM | Admit: 2017-06-06 | Discharge: 2017-06-08 | Disposition: A | Discharge status: Home / Self Care | Payer: Managed Care, Other (non HMO) | Attending: Internal Medicine | Admitting: Internal Medicine

## 2017-06-06 DIAGNOSIS — M25412 Effusion, left shoulder: Secondary | ICD-10-CM | POA: Insufficient documentation

## 2017-06-06 DIAGNOSIS — Z8042 Family history of malignant neoplasm of prostate: Secondary | ICD-10-CM | POA: Insufficient documentation

## 2017-06-06 DIAGNOSIS — E781 Pure hyperglyceridemia: Secondary | ICD-10-CM | POA: Insufficient documentation

## 2017-06-06 DIAGNOSIS — Z79899 Other long term (current) drug therapy: Secondary | ICD-10-CM | POA: Diagnosis not present

## 2017-06-06 DIAGNOSIS — K219 Gastro-esophageal reflux disease without esophagitis: Secondary | ICD-10-CM | POA: Diagnosis not present

## 2017-06-06 DIAGNOSIS — M869 Osteomyelitis, unspecified: Secondary | ICD-10-CM

## 2017-06-06 DIAGNOSIS — F17290 Nicotine dependence, other tobacco product, uncomplicated: Secondary | ICD-10-CM | POA: Insufficient documentation

## 2017-06-06 DIAGNOSIS — M25512 Pain in left shoulder: Secondary | ICD-10-CM

## 2017-06-06 DIAGNOSIS — M954 Acquired deformity of chest and rib: Secondary | ICD-10-CM

## 2017-06-06 DIAGNOSIS — M009 Pyogenic arthritis, unspecified: Secondary | ICD-10-CM | POA: Diagnosis not present

## 2017-06-06 DIAGNOSIS — Z888 Allergy status to other drugs, medicaments and biological substances status: Secondary | ICD-10-CM | POA: Diagnosis not present

## 2017-06-06 DIAGNOSIS — M898X1 Other specified disorders of bone, shoulder: Secondary | ICD-10-CM

## 2017-06-06 DIAGNOSIS — Z8249 Family history of ischemic heart disease and other diseases of the circulatory system: Secondary | ICD-10-CM | POA: Insufficient documentation

## 2017-06-06 DIAGNOSIS — G4733 Obstructive sleep apnea (adult) (pediatric): Secondary | ICD-10-CM | POA: Diagnosis present

## 2017-06-06 DIAGNOSIS — M868X8 Other osteomyelitis, other site: Secondary | ICD-10-CM | POA: Diagnosis not present

## 2017-06-06 DIAGNOSIS — Z6841 Body Mass Index (BMI) 40.0 and over, adult: Secondary | ICD-10-CM | POA: Diagnosis not present

## 2017-06-06 LAB — COMPREHENSIVE METABOLIC PANEL
ALT: 40 U/L (ref 17–63)
ANION GAP: 10 (ref 5–15)
AST: 34 U/L (ref 15–41)
Albumin: 4.2 g/dL (ref 3.5–5.0)
Alkaline Phosphatase: 72 U/L (ref 38–126)
BILIRUBIN TOTAL: 0.7 mg/dL (ref 0.3–1.2)
BUN: 14 mg/dL (ref 6–20)
CHLORIDE: 105 mmol/L (ref 101–111)
CO2: 22 mmol/L (ref 22–32)
Calcium: 9.5 mg/dL (ref 8.9–10.3)
Creatinine, Ser: 1.18 mg/dL (ref 0.61–1.24)
Glucose, Bld: 95 mg/dL (ref 65–99)
POTASSIUM: 3.9 mmol/L (ref 3.5–5.1)
Sodium: 137 mmol/L (ref 135–145)
TOTAL PROTEIN: 7.2 g/dL (ref 6.5–8.1)

## 2017-06-06 LAB — CBC WITH DIFFERENTIAL/PLATELET
BASOS ABS: 0.1 10*3/uL (ref 0.0–0.1)
Basophils Relative: 1 %
EOS PCT: 3 %
Eosinophils Absolute: 0.2 10*3/uL (ref 0.0–0.7)
HEMATOCRIT: 42.5 % (ref 39.0–52.0)
HEMOGLOBIN: 14.5 g/dL (ref 13.0–17.0)
LYMPHS PCT: 30 %
Lymphs Abs: 2.6 10*3/uL (ref 0.7–4.0)
MCH: 31.2 pg (ref 26.0–34.0)
MCHC: 34.1 g/dL (ref 30.0–36.0)
MCV: 91.4 fL (ref 78.0–100.0)
Monocytes Absolute: 0.7 10*3/uL (ref 0.1–1.0)
Monocytes Relative: 8 %
NEUTROS ABS: 5 10*3/uL (ref 1.7–7.7)
NEUTROS PCT: 58 %
PLATELETS: 263 10*3/uL (ref 150–400)
RBC: 4.65 MIL/uL (ref 4.22–5.81)
RDW: 12.6 % (ref 11.5–15.5)
WBC: 8.7 10*3/uL (ref 4.0–10.5)

## 2017-06-06 LAB — C-REACTIVE PROTEIN: CRP: <0.8 mg/dL (ref <1.0)

## 2017-06-06 LAB — SEDIMENTATION RATE: Sed Rate: 7 mm/hr (ref 0–16)

## 2017-06-06 LAB — I-STAT CG4 LACTIC ACID, ED: LACTIC ACID, VENOUS: 1.61 mmol/L (ref 0.5–1.9)

## 2017-06-06 MED ORDER — ACETAMINOPHEN 650 MG RE SUPP
650.0000 mg | Freq: Four times a day (QID) | RECTAL | Status: DC | PRN
Start: 2017-06-06 — End: 2017-06-08

## 2017-06-06 MED ORDER — GADOBENATE DIMEGLUMINE 529 MG/ML IV SOLN
20.0000 mL | Freq: Once | INTRAVENOUS | Status: AC | PRN
  Administered 2017-06-06: 20 mL via INTRAVENOUS

## 2017-06-06 MED ORDER — ACETAMINOPHEN 325 MG PO TABS: 650.0000 mg | ORAL_TABLET | Freq: Four times a day (QID) | ORAL | Status: DC | PRN

## 2017-06-06 MED ORDER — ONDANSETRON HCL 4 MG/2ML IJ SOLN: 4.0000 mg | Freq: Four times a day (QID) | INTRAMUSCULAR | Status: DC | PRN

## 2017-06-06 MED ORDER — IBUPROFEN 200 MG PO TABS: 400.0000 mg | ORAL_TABLET | Freq: Four times a day (QID) | ORAL | Status: DC | PRN

## 2017-06-06 MED ORDER — LORATADINE 10 MG PO TABS: 10.0000 mg | ORAL_TABLET | Freq: Every day | ORAL | Status: DC | PRN

## 2017-06-06 MED ORDER — ONDANSETRON HCL 4 MG PO TABS: 4.0000 mg | ORAL_TABLET | Freq: Four times a day (QID) | ORAL | Status: DC | PRN

## 2017-06-06 NOTE — H&P (Signed)
History and Physical    Scott Norris:[id number] DOB: May 05, 1976 DOA: 06/06/2017  Referring MD/NP/PA: Dr. Kathrynn Humble, PCP: Tammi Sou, MD  Patient coming from: Home   Chief Complaint: Abnormal MRI results  HPI: Scott Norris is a 41 y.o. male with medical history significant of morbid obesity, and OSA on BiPAP; who presents after being instructed to come to the emergency department for abnormal MRI results. Patient notes that over the last 5-6 weeks he's been feeling with intermittent left clavicle pain and swelling that comes and goes. Patient has used over-the-counter NSAIDs as needed for discomfort. Patient describes a sharp nagging-like pain that can be provoked or worsened with certain movements. The area can sometimes become reddened and inflamed, but goes down with time. Denies any fevers, nausea, vomiting, weight loss, bites, or surgeries to that area. He had been going back and forth to his primary care physician. Out what was going on an MRI of the chest was obtained which showed abnormal sternoclavicular joint effusion suspicious for osteomyelitis and septic joint. He was advised to come to the emergency department for further evaluation.  Lastly, patient notes complaints of intermittent episodes of chest tightness that have been going on for the last 1-2 years. Tenderness is located around the left lateral nipple line on it occurs. He's had multiple evaluations regarding the symptoms including negative exercise stress test in 2016 and negative nuclear stress test in 12/2016. No cause can be found to his symptoms and he wonders if the clavicle has something to do with it.   ED Course: Upon admission to the emergency department patient was noted to have vital signs relatively within normal limits. Lab work was relatively unremarkable including sedimentation rate 7 and CRP <0.8. Dr. Wilder Glade of infectious disease was consulted and recommended admission and to check blood cultures  now and then again in the morning. It was advised to hold off administering antibiotics.   Review of Systems  Constitutional: Negative for chills, fever and weight loss.  HENT: Negative for ear discharge and ear pain.   Eyes: Negative for double vision and photophobia.  Respiratory: Negative for hemoptysis and sputum production.   Cardiovascular: Positive for chest pain. Negative for leg swelling.  Gastrointestinal: Negative for abdominal pain and vomiting.  Genitourinary: Negative for frequency and urgency.  Musculoskeletal: Positive for joint pain, myalgias and neck pain. Negative for falls.  Skin: Positive for rash. Negative for itching.  Neurological: Negative for tingling and focal weakness.  Endo/Heme/Allergies: Positive for environmental allergies. Negative for polydipsia.  Psychiatric/Behavioral: Negative for hallucinations and substance abuse.    Past Medical History:  Diagnosis Date  . Family history of prostate cancer    Father  . GERD (gastroesophageal reflux disease)   . History of chest pain 09/2013   Ruled out for ACS at [facility] ED.  Also dx'd with musculoskeletal CP, costochondritis, and GERD for c/o CP in the past.  ETT normal 06/2015.  Dr. Debara Pickett eval 11/2016--plan for cardiac CT  . Hypertriglyceridemia    Trigs>1400 in 2012: lovaza rx'd  . Morbid obesity ([place]) 12/2014   BMI 49  . OSA on CPAP     Past Surgical History:  Procedure Laterality Date  . ESOPHAGOGASTRODUODENOSCOPY     Normal 12/2015 [facility] per pt.  Marland Kitchen ETT  06/2015   Normal  . KNEE SURGERY Right    Arthroscopic  . LASIK  2007  . WRIST SURGERY     Fractured wrist; no hardware in wrist.  reports that he has never smoked. He has quit using smokeless tobacco. His smokeless tobacco use included Snuff. He reports that he drinks about 1.2 oz of alcohol per week . He reports that he does not use drugs.  Allergies  Allergen Reactions  . Naproxen Nausea And Vomiting and Other (See Comments)    Stomach pain     Family History  Problem Relation Age of Onset  . Prostate cancer Father 66  . Hypertension Father     Prior to Admission medications   Medication Sig Start Date End Date Taking? Authorizing Provider  cetirizine (ZYRTEC) 10 MG tablet Take 10 mg by mouth daily as needed (seasonal allergies).   Yes [provider]  ibuprofen (ADVIL,MOTRIN) 200 MG tablet Take 800 mg by mouth daily as needed for headache (pain).   Yes [provider]  PRESCRIPTION MEDICATION Inhale into the lungs at bedtime. CPAP   Yes [provider]    Physical Exam:                                          Constitutional:Obese male in NAD, calm, comfortable Vitals:   06/06/17 2013 06/06/17 2039 06/06/17 2040 06/06/17 2043  BP:  114/72  114/72  Pulse:   79 80  Resp:    (!) 22  Temp:      SpO2:   96% 99%  Weight: (!) 161.5 kg (356 lb)     Height: _0  (1.88 m)      Eyes: PERRL, lids and conjunctivae normal ENMT: Mucous membranes are moist. Posterior pharynx clear of any exudate or lesions.Normal dentition.  Neck: normal, supple, no masses, no thyromegaly Respiratory: clear to auscultation bilaterally, no wheezing, no crackles. Normal respiratory effort. No accessory muscle use.  Cardiovascular: Regular rate and rhythm, no murmurs / rubs / gallops. No extremity edema. 2+ pedal pulses. No carotid bruits.  Abdomen: no tenderness, no masses palpated. No hepatosplenomegaly. Bowel sounds positive.  Musculoskeletal: no clubbing / cyanosis. Deformity of the left sternal clavicular joint with swelling present. No significant erythema or warmth noted. Skin: no rashes, lesions, ulcers. No induration Neurologic: CN 2-12 grossly intact. Sensation intact, DTR normal. Strength 5/5 in all 4.  Psychiatric: Normal judgment and insight. Alert and oriented x 3. Normal mood.     Labs on Admission: I have personally reviewed following labs and imaging studies  CBC:  Recent Labs Lab 06/06/17 1846    WBC 8.7  NEUTROABS 5.0  HGB 14.5  HCT 42.5  MCV 91.4  PLT 115   Basic Metabolic Panel:  Recent Labs Lab 06/06/17 1846  NA 137  K 3.9  CL 105  CO2 22  GLUCOSE 95  BUN 14  CREATININE 1.18  CALCIUM 9.5   GFR: Estimated Creatinine Clearance: 134.1 mL/min (by C-G formula based on SCr of 1.18 mg/dL). Liver Function Tests:  Recent Labs Lab 06/06/17 1846  AST 34  ALT 40  ALKPHOS 72  BILITOT 0.7  PROT 7.2  ALBUMIN 4.2   No results for input(s): LIPASE, AMYLASE in the last 168 hours. No results for input(s): AMMONIA in the last 168 hours. Coagulation Profile: No results for input(s): INR, PROTIME in the last 168 hours. Cardiac Enzymes: No results for input(s): CKTOTAL, CKMB, CKMBINDEX, TROPONINI in the last 168 hours. BNP (last 3 results) No results for input(s): PROBNP in the last 8760 hours. HbA1C: No results  for input(s): HGBA1C in the last 72 hours. CBG: No results for input(s): GLUCAP in the last 168 hours. Lipid Profile: No results for input(s): CHOL, HDL, LDLCALC, TRIG, CHOLHDL, LDLDIRECT in the last 72 hours. Thyroid Function Tests: No results for input(s): TSH, T4TOTAL, FREET4, T3FREE, THYROIDAB in the last 72 hours. Anemia Panel: No results for input(s): VITAMINB12, FOLATE, FERRITIN, TIBC, IRON, RETICCTPCT in the last 72 hours. Urine analysis:    Component Value Date/Time   COLORURINE LT. YELLOW 04/01/2011 [employer] 04/01/2011 0749   LABSPEC 1.025 04/01/2011 0749   PHURINE 6.0 04/01/2011 0749   GLUCOSEU NEGATIVE 04/01/2011 0749   HGBUR NEGATIVE 04/01/2011 0749   HGBUR trace-intact 07/09/2010 0817   BILIRUBINUR NEGATIVE 04/01/2011 0749   KETONESUR NEGATIVE 04/01/2011 0749   UROBILINOGEN 0.2 04/01/2011 0749   NITRITE NEGATIVE 04/01/2011 0749   LEUKOCYTESUR NEGATIVE 04/01/2011 0749   Sepsis Labs: No results found for this or any previous visit (from the past 240 hour(s)).   Radiological Exams on Admission: Mr Chest W Wo  Contrast  Result Date: 06/06/2017 CLINICAL DATA:  Intermittent left sternoclavicular joint pain for 6 weeks. Focal swelling. EXAM: MRI CHEST WITHOUT AND WITH CONTRAST TECHNIQUE: Multiplanar multisequence MR images were obtained through the sternoclavicular joints before and after IV contrast. CONTRAST:  27m MULTIHANCE GADOBENATE DIMEGLUMINE 529 MG/ML IV SOLN COMPARISON:  05/29/2017 radiographs FINDINGS: Despite efforts by the technologist and patient, motion artifact is present on today's exam and could not be eliminated. This reduces exam sensitivity and specificity. There is anterior fusion of the sternoclavicular joint associated with abnormal edema and enhancement particularly in the adjacent sternum and tracking caudad in the sternum to the region of the first costosternal junction. There is also an effusion of the first costosternal junction. No definite abnormal enhancement in the distal clavicle. There is abnormal inflammation of the soft tissues of the mediastinum adjacent to the left first costosternal joint as shown for example on image 5/10. The on assessment of the supraclavicular fossa region, no specific separate abnormality is identified. IMPRESSION: 1. Abnormal left sternoclavicular joint effusion and also joint effusion of the left first costosternal junction, with abnormal edema signal and enhancement within the adjacent sternal manubrium suspicious for of sternal osteomyelitis and septic joint. Unilateral erosive arthropathy is considered a less likely differential diagnostic consideration. Small amount of adjacent upper mediastinal enhancement compatible with adjacent phlegmon. These results will be called to the ordering clinician or representative by the Radiologist Assistant, and communication documented in the PACS or zVision Dashboard. Electronically Signed   By: WVan ClinesM.D.   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 Patient presents with left clavicle pain intermittently off and on for the last 6 months. Labs reveal normal CRP and ESR. Infectious diseaseDr. SBaxter Flatteryconsulted - Admit to MedSurg bed - Follow-up blood cultures - Will need second set of blood cultures tomorrow a.m.  - IR consult ordered for evaluation for possible drainage or further evaluation - Appreciate infectious disease consultative services Dr. SBaxter Flattery will follow further recommendations  - order placed for IR consult to eval for possible need of biopsy in am.  Intermittent chest tightness: Patient reports having previous cardiac evaluations for chest tightness complaints that have been intermittent over the last 1-2 years. Question if this is related to the above.  OSA on BiPAP - BiPAP per RT  DVT prophylaxis: Early ambulation   Code Status: Full  Family Communication: Discussed  the plan of care with the patient family present at bedside Disposition Plan: Likely discharge home in 1-2 days Consults called: ID Admission status: Observation  Norval Morton MD [facility] Pager [phone]   If 7PM-7AM, please contact night-coverage [email or web address] Password [facility]  06/06/2017, 10:19 PM

## 2017-06-06 NOTE — Telephone Encounter (Signed)
Received call regarding MRI results. Spoke with radiology. MRI concerning for septic joint. Called patient. Advised to go to ED. Patient in agreement.

## 2017-06-06 NOTE — ED Notes (Signed)
Pt is to receive No antibiotics per Infectious disease MD who spoke with Dr Rhunette Croft.

## 2017-06-06 NOTE — ED Notes (Signed)
Dr Nanavati in room  

## 2017-06-06 NOTE — ED Triage Notes (Signed)
Pt had an MRI today of his left clavicle. Called by MD and told to come in for diagnosis of osteomyelitis.

## 2017-06-06 NOTE — ED Notes (Signed)
Pt and family made aware of bed assignment 

## 2017-06-07 ENCOUNTER — Encounter (HOSPITAL_COMMUNITY): Payer: Self-pay | Admitting: *Deleted

## 2017-06-07 ENCOUNTER — Observation Stay (HOSPITAL_COMMUNITY): Payer: Managed Care, Other (non HMO)

## 2017-06-07 DIAGNOSIS — M869 Osteomyelitis, unspecified: Secondary | ICD-10-CM | POA: Diagnosis not present

## 2017-06-07 DIAGNOSIS — G4733 Obstructive sleep apnea (adult) (pediatric): Secondary | ICD-10-CM | POA: Diagnosis not present

## 2017-06-07 LAB — PROTIME-INR
INR: 0.95
Prothrombin Time: 12.6 seconds (ref 11.4–15.2)

## 2017-06-07 LAB — APTT: APTT: 29 s (ref 24–36)

## 2017-06-07 NOTE — Progress Notes (Signed)
Pt states Auto CPAP worked good for him overnight. FFM wasn't very comfortable. Spouse brought pt's home mask/tubing, and RT refilled water chamber. Pt states he doesn't need any assistance with CPAP. Advised to notify RT if he needs any adjustments made. RT will continue to monitor.

## 2017-06-07 NOTE — Progress Notes (Signed)
Pt will place himself on when ready for bed.

## 2017-06-07 NOTE — Consult Note (Signed)
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 male with history of OSA, otherwise in good state of health who has had 6 wk history of left clavicular pain for which he has been seeing his PCP for evaluation. He states the area about his left clavicle is intermittently erythematous and swollen. He notices sharp pain when rotating his left arm, as with his golf swing. Pain radiates up neck or to shoulder at times.  He denies any fever, chills, nightsweats, no other joints inflammed, no animal scratches or bites, nor any recent dental procedure, no hx of ivdu. 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 Interestingly, patient has normal wbc, as well as inflammatory markers. He has had 2 sets of blood cx drawn. No abtx started as of yet.  Works as a Therapist, sports projects, office based  Past Medical History:  Diagnosis Date  . Family history of prostate cancer    Father  . GERD (gastroesophageal reflux disease)   . History of chest pain 09/2013   Ruled out for ACS at [facility] ED.  Also dx'd with musculoskeletal CP, costochondritis, and GERD for c/o CP in the past.  ETT normal 06/2015.  Dr. Debara Pickett eval 11/2016--plan for cardiac CT  . Hypertriglyceridemia    Trigs>1400 in 2012: lovaza rx'd  . Morbid obesity ([place]) 12/2014   BMI 49  . OSA on CPAP     Allergies:  Allergies  Allergen Reactions  . Naproxen Nausea And Vomiting and Other (See Comments)    Stomach pain   MEDICATIONS:   Social History  Substance Use Topics  . Smoking status: Never Smoker  . Smokeless tobacco: Former Systems developer    Types: Snuff  . Alcohol use 1.2 oz/week    2 Shots of liquor  per week     Comment: 4-5 weekly    Family History  Problem Relation Age of Onset  . Prostate cancer Father 11  . Hypertension Father      Review of Systems  Constitutional: Negative for fever, chills, diaphoresis, activity change, appetite change, fatigue and unexpected weight change.  HENT: Negative for congestion, sore throat, rhinorrhea, sneezing, trouble swallowing and sinus pressure.  Eyes: Negative for photophobia and visual disturbance.  Respiratory: Negative for cough, chest tightness, shortness of breath, wheezing and stridor.  Cardiovascular: Negative for chest pain, palpitations and leg swelling.  Gastrointestinal: Negative for nausea, vomiting, abdominal pain, diarrhea, constipation, blood in stool, abdominal distention and anal bleeding.  Genitourinary: Negative for dysuria, hematuria, flank pain and difficulty urinating.  Musculoskeletal: left sided clavicular pain Negative for myalgias, back pain, joint swelling, arthralgias and gait problem.  Skin: Negative for color change, pallor, rash and wound.  Neurological: Negative for dizziness, tremors, weakness and light-headedness.  Hematological: Negative for adenopathy. Does not bruise/bleed easily.  Psychiatric/Behavioral: Negative for behavioral problems, confusion, sleep disturbance, dysphoric mood, decreased concentration and agitation.     OBJECTIVE: Temp:  [97.5 F (36.4 C)-98.4 F (36.9 C)] 97.8 F (36.6 C) (09/16 0500) Pulse Rate:  [76-83] 76 (09/16 0500) Resp:  [18-22] 18 (09/16 0500) BP: (114-134)/(64-86) 126/67 (09/16 0500) SpO2:  [94 %-99 %] 97 % (  09/16 0500) Weight:  [356 lb (161.5 kg)] 356 lb (161.5 kg) (09/15 2013) Physical Exam  Constitutional: He is oriented to person, place, and time. He appears well-developed and well-nourished. No distress.  HENT:  Mouth/Throat: Oropharynx is clear and moist. No oropharyngeal exudate.  Cardiovascular: Normal rate, regular rhythm and normal heart sounds. Exam  reveals no gallop and no friction rub.  No murmur heard.  Pulmonary/Chest: Effort normal and breath sounds normal. No respiratory distress. He has no wheezes.  Chest wall = left clavicular head/[place] joint slightly swollen, no heat or erythema, mildly tender with palpation. No fluctation or guarding Abdominal: Soft. Bowel sounds are normal. He exhibits no distension. There is no tenderness.  Lymphadenopathy:  He has no cervical adenopathy.  Neurological: He is alert and oriented to person, place, and time.  Skin: Skin is warm and dry. No rash noted. No erythema.  Psychiatric: He has a normal mood and affect. His behavior is normal.     LABS: Results for orders placed or performed during the hospital encounter of 06/06/17 (from the past 48 hour(s))  Comprehensive metabolic panel     Status: None   Collection Time: 06/06/17  6:46 PM  Result Value Ref Range   Sodium 137 135 - 145 mmol/L   Potassium 3.9 3.5 - 5.1 mmol/L   Chloride 105 101 - 111 mmol/L   CO2 22 22 - 32 mmol/L   Glucose, Bld 95 65 - 99 mg/dL   BUN 14 6 - 20 mg/dL   Creatinine, Ser 1.18 0.61 - 1.24 mg/dL   Calcium 9.5 8.9 - 10.3 mg/dL   Total Protein 7.2 6.5 - 8.1 g/dL   Albumin 4.2 3.5 - 5.0 g/dL   AST 34 15 - 41 U/L   ALT 40 17 - 63 U/L   Alkaline Phosphatase 72 38 - 126 U/L   Total Bilirubin 0.7 0.3 - 1.2 mg/dL   GFR calc non Af Amer >60 >60 mL/min   GFR calc Af Amer >60 >60 mL/min    Comment: (NOTE) The eGFR has been calculated using the CKD EPI equation. This calculation has not been validated in all clinical situations. eGFR's persistently <60 mL/min signify possible Chronic Kidney Disease.    Anion gap 10 5 - 15  CBC with Differential     Status: None   Collection Time: 06/06/17  6:46 PM  Result Value Ref Range   WBC 8.7 4.0 - 10.5 K/uL   RBC 4.65 4.22 - 5.81 MIL/uL   Hemoglobin 14.5 13.0 - 17.0 g/dL   HCT 42.5 39.0 - 52.0 %   MCV 91.4 78.0 - 100.0 fL   MCH 31.2 26.0 - 34.0 pg   MCHC 34.1 30.0 - 36.0  g/dL   RDW 12.6 11.5 - 15.5 %   Platelets 263 150 - 400 K/uL   Neutrophils Relative % 58 %   Neutro Abs 5.0 1.7 - 7.7 K/uL   Lymphocytes Relative 30 %   Lymphs Abs 2.6 0.7 - 4.0 K/uL   Monocytes Relative 8 %   Monocytes Absolute 0.7 0.1 - 1.0 K/uL   Eosinophils Relative 3 %   Eosinophils Absolute 0.2 0.0 - 0.7 K/uL   Basophils Relative 1 %   Basophils Absolute 0.1 0.0 - 0.1 K/uL  I-Stat CG4 Lactic Acid, ED     Status: None   Collection Time: 06/06/17  7:05 PM  Result Value Ref Range   Lactic Acid, Venous 1.61 0.5 - 1.9 mmol/L  C-reactive protein  Status: None   Collection Time: 06/06/17  8:31 PM  Result Value Ref Range   CRP <0.8 <1.0 mg/dL  Sedimentation rate     Status: None   Collection Time: 06/06/17  8:31 PM  Result Value Ref Range   Sed Rate 7 0 - 16 mm/hr  Protime-INR     Status: None   Collection Time: 06/07/17 12:01 AM  Result Value Ref Range   Prothrombin Time 12.6 11.4 - 15.2 seconds   INR 0.95   APTT     Status: None   Collection Time: 06/07/17 12:01 AM  Result Value Ref Range   aPTT 29 24 - 36 seconds    MICRO: 9/15 blood cx ngtd 9/16 blood cx pending IMAGING: Mr Chest W Wo Contrast  Result Date: 06/06/2017 CLINICAL DATA:  Intermittent left sternoclavicular joint pain for 6 weeks. Focal swelling. EXAM: MRI CHEST WITHOUT AND WITH CONTRAST TECHNIQUE: Multiplanar multisequence MR images were obtained through the sternoclavicular joints before and after IV contrast. CONTRAST:  74m MULTIHANCE GADOBENATE DIMEGLUMINE 529 MG/ML IV SOLN COMPARISON:  05/29/2017 radiographs FINDINGS: Despite efforts by the technologist and patient, motion artifact is present on today's exam and could not be eliminated. This reduces exam sensitivity and specificity. There is anterior fusion of the sternoclavicular joint associated with abnormal edema and enhancement particularly in the adjacent sternum and tracking caudad in the sternum to the region of the first costosternal junction.  There is also an effusion of the first costosternal junction. No definite abnormal enhancement in the distal clavicle. There is abnormal inflammation of the soft tissues of the mediastinum adjacent to the left first costosternal joint as shown for example on image 5/10. The on assessment of the supraclavicular fossa region, no specific separate abnormality is identified. IMPRESSION: 1. Abnormal left sternoclavicular joint effusion and also joint effusion of the left first costosternal junction, with abnormal edema signal and enhancement within the adjacent sternal manubrium suspicious for of sternal osteomyelitis and septic joint. Unilateral erosive arthropathy is considered a less likely differential diagnostic consideration. Small amount of adjacent upper mediastinal enhancement compatible with adjacent phlegmon. These results will be called to the ordering clinician or representative by the Radiologist Assistant, and communication documented in the PACS or zVision Dashboard. Electronically Signed   By: WVan ClinesM.D.   On: 06/06/2017 15:04    Assessment/Plan:  40yo with possible Left sided [place] septic arthritis, sternal osteo on imaging, however his clinical presentation is an indolent process so I would be very surprised if this is staph aureus but possibly strep or actinomyces or sterile abscess. He does not recall specific event which would make one think he was temporarily bacteremic  - please have IR sample affected area. Send for culture as well as cytology - recommend to get CT surgery consultation to see if they would recommend doing debridement now vs. Seeing after he responds to abtx. Also would see if they have seen a presentation like this that is non-infectious process. - continue off of abtx for now since he is clinically stable - await gram stain from fluid to see if can devise empiric regimen - check hiv ab and hep C as part of health maintenance  Dr HJohnnye Simato see tomorrow

## 2017-06-07 NOTE — Progress Notes (Signed)
Pt set up on Auto Bipap (Max 20cmH20-min 8cmH20) with FFM. Pt not ready to wear for the night, showed pt how to turn machine on and off. Advised pt to notify RN if any assistance needed with machine. RT will continue to monitor.

## 2017-06-07 NOTE — Progress Notes (Signed)
PROGRESS NOTE    Scott Norris  YSH:[id number] DOB: [birth year] DOA: 06/06/2017 PCP: Scott Sou, MD  Brief Narrative: Scott Norris is a 41 y.o. male with medical history significant of morbid obesity, and OSA on BiPAP; who presents after being instructed to come to the emergency department for abnormal MRI results. Patient notes that over the last 5-6 weeks he's been feeling with intermittent left clavicle pain and swelling that comes and goes. MRI of the chest 9/15 which showed abnormal sternoclavicular joint effusion suspicious for osteomyelitis and septic joint. ID consulted, IR aspiration ordered   Assessment & Plan:   ? Sternal osteomyelitis and [place] joint effusion/? Septic joint -Patient presents with left clavicle pain intermittently off and on for the last 6 months -normal CRP and ESR, no clear risk factors for infection and no evidence of other joint/MSK or rheum issues  Or trauma -d/w Scott Norris IR for joint aspiration today -No Abx at this time -ID Scott Norris consulted -FU Blood Cx-NGTD  Intermittent chest tightness: Patient reports having previous cardiac evaluations for chest tightness complaints that have been intermittent over the last 1-2 years.  -suspect was related to this  OSA on BiPAP - CPAP per RT  DVT prophylaxis: SCDs Code Status: Full  Family Communication: wife at bedside Disposition Plan: Home pending workup  Consultants:  ID and IR   Subjective: Feels ok, mild ? [place] joint discomfort, no fevers, no other joint problems or injuries  Objective: Vitals:   06/06/17 2311 06/06/17 2323 06/07/17 0004 06/07/17 0500  BP: 130/83  131/64 126/67  Pulse:   80 76  Resp:   18 18  Temp:  98.4 F (36.9 C) (!) 97.5 F (36.4 C) 97.8 F (36.6 C)  TempSrc:  Oral Oral Oral  SpO2: 99%  94% 97%  Weight:      Height:       No intake or output data in the 24 hours ending 06/07/17 1139 Filed Weights   06/06/17 2013  Weight: (!) 161.5 kg (356 lb)     Examination:  General exam: Appears calm and comfortable  Respiratory system: Clear to auscultation. Respiratory effort normal. Cardiovascular system: S1 & S2 heard, RRR. No JVD, murmurs, rubs, gallops or clicks. No pedal edema. Gastrointestinal system: Abdomen is nondistended, soft and nontender. No organomegaly or masses felt. Normal bowel sounds heard. Central nervous system: Alert and oriented. No focal neurological deficits. Extremities: no edema, mild tenderness of L Scott Norris joint Skin: No rashes, lesions or ulcers Psychiatry: Judgement and insight appear normal. Mood & affect appropriate.     Data Reviewed:   CBC:  Recent Labs Lab 06/06/17 1846  WBC 8.7  NEUTROABS 5.0  HGB 14.5  HCT 42.5  MCV 91.4  PLT 115   Basic Metabolic Panel:  Recent Labs Lab 06/06/17 1846  NA 137  K 3.9  CL 105  CO2 22  GLUCOSE 95  BUN 14  CREATININE 1.18  CALCIUM 9.5   GFR: Estimated Creatinine Clearance: 134.1 mL/min (by C-G formula based on SCr of 1.18 mg/dL). Liver Function Tests:  Recent Labs Lab 06/06/17 1846  AST 34  ALT 40  ALKPHOS 72  BILITOT 0.7  PROT 7.2  ALBUMIN 4.2   No results for input(s): LIPASE, AMYLASE in the last 168 hours. No results for input(s): AMMONIA in the last 168 hours. Coagulation Profile:  Recent Labs Lab 06/07/17 0001  INR 0.95   Cardiac Enzymes: No results for input(s): CKTOTAL, CKMB, CKMBINDEX, TROPONINI in the last 168  hours. BNP (last 3 results) No results for input(s): PROBNP in the last 8760 hours. HbA1C: No results for input(s): HGBA1C in the last 72 hours. CBG: No results for input(s): GLUCAP in the last 168 hours. Lipid Profile: No results for input(s): CHOL, HDL, LDLCALC, TRIG, CHOLHDL, LDLDIRECT in the last 72 hours. Thyroid Function Tests: No results for input(s): TSH, T4TOTAL, FREET4, T3FREE, THYROIDAB in the last 72 hours. Anemia Panel: No results for input(s): VITAMINB12, FOLATE, FERRITIN, TIBC, IRON, RETICCTPCT in  the last 72 hours. Urine analysis:    Component Value Date/Time   COLORURINE LT. YELLOW 04/01/2011 0749   APPEARANCEUR CLEAR 04/01/2011 0749   LABSPEC 1.025 04/01/2011 0749   PHURINE 6.0 04/01/2011 0749   GLUCOSEU NEGATIVE 04/01/2011 0749   HGBUR NEGATIVE 04/01/2011 0749   HGBUR trace-intact 07/09/2010 0817   BILIRUBINUR NEGATIVE 04/01/2011 0749   KETONESUR NEGATIVE 04/01/2011 0749   UROBILINOGEN 0.2 04/01/2011 0749   NITRITE NEGATIVE 04/01/2011 0749   LEUKOCYTESUR NEGATIVE 04/01/2011 0749   Sepsis Labs: _0 (procalcitonin:4,lacticidven:4)  ) Recent Results (from the past 240 hour(s))  Blood culture (routine x 2)     Status: None (Preliminary result)   Collection Time: 06/06/17  8:40 PM  Result Value Ref Range Status   Specimen Description BLOOD RIGHT ARM  Final   Special Requests   Final    BOTTLES DRAWN AEROBIC AND ANAEROBIC Blood Culture adequate volume   Culture NO GROWTH < 12 HOURS  Final   Report Status PENDING  Incomplete  Blood culture (routine x 2)     Status: None (Preliminary result)   Collection Time: 06/06/17  8:55 PM  Result Value Ref Range Status   Specimen Description BLOOD RIGHT FOREARM  Final   Special Requests   Final    BOTTLES DRAWN AEROBIC AND ANAEROBIC Blood Culture adequate volume   Culture NO GROWTH < 12 HOURS  Final   Report Status PENDING  Incomplete         Radiology Studies: Mr Chest W Wo Contrast  Result Date: 06/06/2017 CLINICAL DATA:  Intermittent left sternoclavicular joint pain for 6 weeks. Focal swelling. EXAM: MRI CHEST WITHOUT AND WITH CONTRAST TECHNIQUE: Multiplanar multisequence MR images were obtained through the sternoclavicular joints before and after IV contrast. CONTRAST:  22m MULTIHANCE GADOBENATE DIMEGLUMINE 529 MG/ML IV SOLN COMPARISON:  05/29/2017 radiographs FINDINGS: Despite efforts by the technologist and patient, motion artifact is present on today's exam and could not be eliminated. This reduces exam  sensitivity and specificity. There is anterior fusion of the sternoclavicular joint associated with abnormal edema and enhancement particularly in the adjacent sternum and tracking caudad in the sternum to the region of the first costosternal junction. There is also an effusion of the first costosternal junction. No definite abnormal enhancement in the distal clavicle. There is abnormal inflammation of the soft tissues of the mediastinum adjacent to the left first costosternal joint as shown for example on image 5/10. The on assessment of the supraclavicular fossa region, no specific separate abnormality is identified. IMPRESSION: 1. Abnormal left sternoclavicular joint effusion and also joint effusion of the left first costosternal junction, with abnormal edema signal and enhancement within the adjacent sternal manubrium suspicious for of sternal osteomyelitis and septic joint. Unilateral erosive arthropathy is considered a less likely differential diagnostic consideration. Small amount of adjacent upper mediastinal enhancement compatible with adjacent phlegmon. These results will be called to the ordering clinician or representative by the Radiologist Assistant, and communication documented in the PACS or zVision Dashboard. Electronically Signed  By: Van Clines M.D.   On: 06/06/2017 15:04        Scheduled Meds: Continuous Infusions:   LOS: 0 days    Time spent: 65mn    PDomenic Polite MD [facility] Pager [phone] If 7PM-7AM, please contact night-coverage [email or web address] Password [email or web address]9/16/2018, 11:39 AM

## 2017-06-07 NOTE — Procedures (Signed)
Interventional Radiology Procedure Note  Procedure: US guided aspirate of left [place] joint fluid.   Findings:  Scant fluid aspirated.  Thin yellow fluid.  Sample to lab for culture.  .  Complications: None  Recommendations:  - Follow up culture - Do not submerge  - Routine wound care   Signed,  Yvone Neu. Loreta Ave, DO

## 2017-06-08 ENCOUNTER — Telehealth: Payer: Self-pay | Admitting: *Deleted

## 2017-06-08 ENCOUNTER — Telehealth: Payer: Self-pay

## 2017-06-08 DIAGNOSIS — G4733 Obstructive sleep apnea (adult) (pediatric): Secondary | ICD-10-CM | POA: Diagnosis not present

## 2017-06-08 DIAGNOSIS — M954 Acquired deformity of chest and rib: Secondary | ICD-10-CM

## 2017-06-08 DIAGNOSIS — M869 Osteomyelitis, unspecified: Secondary | ICD-10-CM | POA: Diagnosis not present

## 2017-06-08 LAB — BLOOD CULTURE ID PANEL (REFLEXED)
Acinetobacter baumannii: NOT DETECTED
CANDIDA GLABRATA: NOT DETECTED
CANDIDA KRUSEI: NOT DETECTED
CANDIDA PARAPSILOSIS: NOT DETECTED
Candida albicans: NOT DETECTED
Candida tropicalis: NOT DETECTED
Carbapenem resistance: NOT DETECTED
ENTEROBACTER CLOACAE COMPLEX: NOT DETECTED
ENTEROBACTERIACEAE SPECIES: NOT DETECTED
ENTEROCOCCUS SPECIES: NOT DETECTED
ESCHERICHIA COLI: NOT DETECTED
Haemophilus influenzae: NOT DETECTED
KLEBSIELLA OXYTOCA: NOT DETECTED
KLEBSIELLA PNEUMONIAE: NOT DETECTED
Listeria monocytogenes: NOT DETECTED
Methicillin resistance: NOT DETECTED
Neisseria meningitidis: NOT DETECTED
Proteus species: NOT DETECTED
Pseudomonas aeruginosa: NOT DETECTED
STAPHYLOCOCCUS AUREUS BCID: NOT DETECTED
STREPTOCOCCUS AGALACTIAE: NOT DETECTED
STREPTOCOCCUS PNEUMONIAE: NOT DETECTED
Serratia marcescens: NOT DETECTED
Streptococcus pyogenes: NOT DETECTED
Streptococcus species: NOT DETECTED
Vancomycin resistance: NOT DETECTED

## 2017-06-08 LAB — CBC
HEMATOCRIT: 43.7 % (ref 39.0–52.0)
HEMOGLOBIN: 14.8 g/dL (ref 13.0–17.0)
MCH: 31.1 pg (ref 26.0–34.0)
MCHC: 33.9 g/dL (ref 30.0–36.0)
MCV: 91.8 fL (ref 78.0–100.0)
Platelets: 251 10*3/uL (ref 150–400)
RBC: 4.76 MIL/uL (ref 4.22–5.81)
RDW: 12.6 % (ref 11.5–15.5)
WBC: 6.6 10*3/uL (ref 4.0–10.5)

## 2017-06-08 LAB — BASIC METABOLIC PANEL
ANION GAP: 7 (ref 5–15)
BUN: 13 mg/dL (ref 6–20)
CALCIUM: 9.3 mg/dL (ref 8.9–10.3)
CHLORIDE: 103 mmol/L (ref 101–111)
CO2: 25 mmol/L (ref 22–32)
CREATININE: 1.12 mg/dL (ref 0.61–1.24)
GFR calc non Af Amer: >60 mL/min (ref >60)
Glucose, Bld: 99 mg/dL (ref 65–99)
Potassium: 4.2 mmol/L (ref 3.5–5.1)
SODIUM: 135 mmol/L (ref 135–145)

## 2017-06-08 LAB — URIC ACID: URIC ACID, SERUM: 5.4 mg/dL (ref 4.4–7.6)

## 2017-06-08 NOTE — Progress Notes (Signed)
PHARMACY - PHYSICIAN COMMUNICATION CRITICAL VALUE ALERT - BLOOD CULTURE IDENTIFICATION (BCID)  Results for orders placed or performed during the hospital encounter of 06/06/17  Blood Culture ID Panel (Reflexed) (Collected: 06/06/2017  8:40 PM)  Result Value Ref Range   Enterococcus species PENDING NOT DETECTED   Vancomycin resistance PENDING NOT DETECTED   Listeria monocytogenes PENDING NOT DETECTED   Staphylococcus species (A) NOT DETECTED    CRITICAL RESULT CALLED TO, READ BACK BY AND VERIFIED WITH:   Staphylococcus aureus PENDING NOT DETECTED   Methicillin resistance PENDING NOT DETECTED   Streptococcus species PENDING NOT DETECTED   Streptococcus agalactiae PENDING NOT DETECTED   Streptococcus pneumoniae PENDING NOT DETECTED   Streptococcus pyogenes PENDING NOT DETECTED   Acinetobacter baumannii PENDING NOT DETECTED   Enterobacteriaceae species PENDING NOT DETECTED   Enterobacter cloacae complex PENDING NOT DETECTED   Escherichia coli PENDING NOT DETECTED   Klebsiella oxytoca PENDING NOT DETECTED   Klebsiella pneumoniae PENDING NOT DETECTED   Proteus species PENDING NOT DETECTED   Serratia marcescens PENDING NOT DETECTED   Carbapenem resistance PENDING NOT DETECTED   Haemophilus influenzae PENDING NOT DETECTED   Neisseria meningitidis PENDING NOT DETECTED   Pseudomonas aeruginosa PENDING NOT DETECTED   Candida albicans PENDING NOT DETECTED   Candida glabrata PENDING NOT DETECTED   Candida krusei PENDING NOT DETECTED   Candida parapsilosis PENDING NOT DETECTED   Candida tropicalis PENDING NOT DETECTED    Name of physician (or Provider) Contacted:   Dr. Antionette Char  Changes to prescribed antibiotics required:     None at this time.  Possible contamination.  F/U ID recommendations in AM.   Eddie Candle 06/08/2017  3:58 AM

## 2017-06-08 NOTE — Progress Notes (Signed)
Pt discharged to home in stable condition, PIV removed from LFA without difficulty, all discharge instructions reviewed with pt and spouse.  No rx's.  Pt refused transport chose to ambulate independently off unit with belongings.  [id number]

## 2017-06-08 NOTE — Progress Notes (Signed)
INFECTIOUS DISEASE PROGRESS NOTE  ID: Scott Norris is a 41 y.o. male with  Principal Problem:   Sternal osteomyelitis (HCC) Active Problems:   OSA treated with BiPAP  Subjective: No fever, mild tenderness at L [place] joint.   Abtx:  Anti-infectives    None      Medications: Scheduled:   Objective: Vital signs in last 24 hours: Temp:  [97.5 F (36.4 C)-98.2 F (36.8 C)] 97.8 F (36.6 C) (09/17 1500) Pulse Rate:  [63-82] 82 (09/17 1500) Resp:  [18] 18 (09/17 1500) BP: (123-135)/(62-78) 135/78 (09/17 1500) SpO2:  [95 %-100 %] 98 % (09/17 1500)   General appearance: alert, cooperative and no distress Resp: clear to auscultation bilaterally Chest wall: no tenderness, FROM of L shoulder, no pain. minimal [place] swelling. no heat.  Cardio: regular rate and rhythm GI: normal findings: bowel sounds normal and soft, non-tender  Lab Results  Recent Labs  06/06/17 1846 06/08/17 0959  WBC 8.7 6.6  HGB 14.5 14.8  HCT 42.5 43.7  NA 137 135  K 3.9 4.2  CL 105 103  CO2 22 25  BUN 14 13  CREATININE 1.18 1.12   Liver Panel  Recent Labs  06/06/17 1846  PROT 7.2  ALBUMIN 4.2  AST 34  ALT 40  ALKPHOS 72  BILITOT 0.7   Sedimentation Rate  Recent Labs  06/06/17 2031  ESRSEDRATE 7   C-Reactive Protein  Recent Labs  06/06/17 2031  CRP <0.8    Microbiology: Recent Results (from the past 240 hour(s))  Blood culture (routine x 2)     Status: Abnormal (Preliminary result)   Collection Time: 06/06/17  8:40 PM  Result Value Ref Range Status   Specimen Description BLOOD RIGHT ARM  Final   Special Requests   Final    BOTTLES DRAWN AEROBIC AND ANAEROBIC Blood Culture adequate volume   Culture  Setup Time   Final    AEROBIC BOTTLE ONLY GRAM POSITIVE COCCI IN CLUSTERS   Culture STAPHYLOCOCCUS SPECIES (COAGULASE NEGATIVE) (A)  Final   Report Status PENDING  Incomplete  Blood Culture ID Panel (Reflexed)     Status: Abnormal   Collection Time: 06/06/17  8:40 PM    Result Value Ref Range Status   Enterococcus species NOT DETECTED NOT DETECTED Final   Vancomycin resistance NOT DETECTED NOT DETECTED Final   Listeria monocytogenes NOT DETECTED NOT DETECTED Final   Staphylococcus species (A) NOT DETECTED Final    CRITICAL RESULT CALLED TO, READ BACK BY AND VERIFIED WITH:    Comment: TO  GABBOTT(PHARMd) BY TCLEVELAND 06/08/2017 AT 3:52AM   Staphylococcus aureus NOT DETECTED NOT DETECTED Final   Methicillin resistance NOT DETECTED NOT DETECTED Final   Streptococcus species NOT DETECTED NOT DETECTED Final   Streptococcus agalactiae NOT DETECTED NOT DETECTED Final   Streptococcus pneumoniae NOT DETECTED NOT DETECTED Final   Streptococcus pyogenes NOT DETECTED NOT DETECTED Final   Acinetobacter baumannii NOT DETECTED NOT DETECTED Final   Enterobacteriaceae species NOT DETECTED NOT DETECTED Final   Enterobacter cloacae complex NOT DETECTED NOT DETECTED Final   Escherichia coli NOT DETECTED NOT DETECTED Final   Klebsiella oxytoca NOT DETECTED NOT DETECTED Final   Klebsiella pneumoniae NOT DETECTED NOT DETECTED Final   Proteus species NOT DETECTED NOT DETECTED Final   Serratia marcescens NOT DETECTED NOT DETECTED Final   Carbapenem resistance NOT DETECTED NOT DETECTED Final   Haemophilus influenzae NOT DETECTED NOT DETECTED Final   Neisseria meningitidis NOT DETECTED NOT DETECTED Final  Pseudomonas aeruginosa NOT DETECTED NOT DETECTED Final   Candida albicans NOT DETECTED NOT DETECTED Final   Candida glabrata NOT DETECTED NOT DETECTED Final   Candida krusei NOT DETECTED NOT DETECTED Final   Candida parapsilosis NOT DETECTED NOT DETECTED Final   Candida tropicalis NOT DETECTED NOT DETECTED Final  Blood culture (routine x 2)     Status: None (Preliminary result)   Collection Time: 06/06/17  8:55 PM  Result Value Ref Range Status   Specimen Description BLOOD RIGHT FOREARM  Final   Special Requests   Final    BOTTLES DRAWN AEROBIC AND ANAEROBIC Blood  Culture adequate volume   Culture NO GROWTH 2 DAYS  Final   Report Status PENDING  Incomplete  Culture, blood (routine x 2)     Status: None (Preliminary result)   Collection Time: 06/07/17  8:09 AM  Result Value Ref Range Status   Specimen Description BLOOD RIGHT HAND  Final   Special Requests IN PEDIATRIC BOTTLE Blood Culture adequate volume  Final   Culture NO GROWTH 1 DAY  Final   Report Status PENDING  Incomplete  Culture, blood (routine x 2)     Status: None (Preliminary result)   Collection Time: 06/07/17  8:13 AM  Result Value Ref Range Status   Specimen Description BLOOD LEFT ARM  Final   Special Requests IN PEDIATRIC BOTTLE Blood Culture adequate volume  Final   Culture NO GROWTH 1 DAY  Final   Report Status PENDING  Incomplete  Aerobic/Anaerobic Culture (surgical/deep wound)     Status: None (Preliminary result)   Collection Time: 06/07/17  1:41 PM  Result Value Ref Range Status   Specimen Description FLUID STERNOCLAVICULAR JOINT LEFT  Final   Special Requests NONE  Final   Gram Stain NO WBC SEEN NO ORGANISMS SEEN   Final   Culture NO GROWTH < 24 HOURS  Final   Report Status PENDING  Incomplete    Studies/Results: US Aspiration  Result Date: 06/07/2017 INDICATION: 41 year old male with a history of possible left sternoclavicular septic joint EXAM: ULTRASOUND-GUIDED ASPIRATION LEFT [place] JOINT MEDICATIONS: None ANESTHESIA/SEDATION: None COMPLICATIONS: None PROCEDURE: Informed written consent was obtained from the patient after a thorough discussion of the procedural risks, benefits and alternatives. All questions were addressed. Maximal Sterile Barrier Technique was utilized including caps, mask, sterile gowns, sterile gloves, sterile drape, hand hygiene and skin antiseptic. A timeout was performed prior to the initiation of the procedure. Patient was positioned supine position on the ultrasound stretcher. Images of the left sternoclavicular joint were performed with images  stored sent to PACs. The patient is then prepped and draped in the usual sterile fashion. 1% lidocaine was used for local anesthesia. Two separate approach with longitudinal and transverse approach to the [facility] joint were performed under ultrasound guidance. Aspirin was attempted. Scant amount of thin yellow fluid aspirated. Sample was sent the lab for analysis. Final image was stored. Sterile bandage was placed. Patient tolerated the procedure well and remained hemodynamically stable throughout. No complications were encountered and no significant blood loss IMPRESSION: Status post ultrasound-guided aspirin of left sternoclavicular joint with sample sent the lab for analysis. Signed, Yvone Neu. Loreta Ave, DO [facility] Electronically Signed   By: Gilmer Mor D.O.   On: 06/07/2017 14:47     Assessment/Plan: [place] arthritis  Total days of antibiotics: 0   Would Watch off anbx Will f/u his Cx.  He will f/u with ortho at d/c.  Available as  needed.          Scott Norris Infectious Diseases (pager) [phone] [email or web address] 06/08/2017, 4:31 PM  LOS: 0 days

## 2017-06-08 NOTE — Telephone Encounter (Signed)
Scott Norris [facility] Night - Client TELEPHONE ADVICE RECORD [facility] Patient Name: Scott Norris Gender: Male DOB: 06-06-1976 Age: 41 Y 11 M 25 D Return Phone Number: City/State/Zip: [place] Statistician [facility] Care [facility] Night - Client Client Site [facility] [place] Night Physician Scott Norris - MD Who Is Calling Lab Lab Name [facility] Lab Phone Number [phone] Lab Tech Name Scott Norris Lab Reference Number Chief Complaint Lab Result (Critical or Stat) Call Type Lab Send to RN Reason for Call Report lab results Initial Comment Caller states is Scott Norris w/[facility] and have critical lab results. Additional Comment Nurse Assessment Nurse: Su Hilt, RN, Werner Lean Date/Time (Eastern Time): 06/06/2017 3:32:17 PM Is there an on-call provider listed? ---Yes Please list name of person reporting value (Lab Employee) and a contact number. ---Caller states is Scott Norris with [facility] and have critical lab results, [phone] Please document the following items: Lab name Lab value (read back to lab to verify) Reference range for lab value Date and time blood was drawn Collect time of birth for bilirubin results ---Collection date: 9/15 MRI -- abnormal sternoclavicular joint effusion. suspicious for sterna osteomyelitis and septic joint. Please collect the patient contact information from the lab. (name, phone number and address) ---None from lab, she advised to call [facility] [phone]. [facility] with [facility] stated to contact Medical Records [phone]. No answer from medical record. Guidelines Guideline Title Affirmed Question Disp. Time Lamount Cohen Time) Disposition Final User 06/06/2017 3:51:41 PM Clinical Call Yes Su Hilt, RN, [facility] Paging DoctorName Phone DateTime Action Result/Outcome Notes Everlene Other - DO [phone] 06/06/2017 3:50:47 PM Doctor Paged Jeanene Erb On Call Provider -  Reached Everlene Other - DO 06/06/2017 3:51:26 PM Message Result Spoke with On Call - General Spoke with Dr. Adriana Simas and gave report and then warm transferred to radiology directly

## 2017-06-08 NOTE — Consult Note (Signed)
Reason for Consult:[name] osteomyelitis Referring Physician: Lenna Norris Scott Norris is an 41 y.o. male.  HPI: Scott Norris has been experiencing mild left [place] Norris pain and swelling for about 6 weeks. This is intermittent. It does not seem to be associated with any particular event though he notices it more when playing golf. He has occasionally taken an NSAID for it but usually doesn't need to. It tends to resolve in a few days. It is neither getting worse or more frequent. He went to see his PCP and eventually obtained an MRI that showed osteomyelitis and he was referred to the ED for admission. He underwent aspiration by IR and GS showed no organisms or WBC's. Cultures are pending. He denies any prior Norris problems with the exception of some chronic ankle pain from his days playing football and weightlifting, constipation, diarrhea, bone pain, fevers, back pain, or rash/acne issues.  Past Medical History:  Diagnosis Date  . Family history of prostate cancer    Father  . GERD (gastroesophageal reflux disease)   . History of chest pain 09/2013   Ruled out for ACS at [facility] ED.  Also dx'd with musculoskeletal CP, costochondritis, and GERD for c/o CP in the past.  ETT normal 06/2015.  Dr. Debara Pickett eval 11/2016--plan for cardiac CT  . Hypertriglyceridemia    Trigs>1400 in 2012: lovaza rx'd  . Morbid obesity ([place]) 12/2014   BMI 49  . OSA on CPAP     Past Surgical History:  Procedure Laterality Date  . ESOPHAGOGASTRODUODENOSCOPY     Normal 12/2015 [facility] per pt.  Marland Kitchen ETT  06/2015   Normal  . KNEE SURGERY Right    Arthroscopic  . LASIK  2007  . WRIST SURGERY     Fractured wrist; no hardware in wrist.    Family History  Problem Relation Age of Onset  . Prostate cancer Father 53  . Hypertension Father     Social History:  reports that he has never smoked. He has quit using smokeless tobacco. His smokeless tobacco use included Snuff. He reports that he drinks about 1.2 oz of alcohol per week  . He reports that he does not use drugs.  Allergies:  Allergies  Allergen Reactions  . Naproxen Nausea And Vomiting and Other (See Comments)    Stomach pain    Medications: I have reviewed the patient's current medications.  Results for orders placed or performed during the hospital encounter of 06/06/17 (from the past 48 hour(s))  Comprehensive metabolic panel     Status: None   Collection Time: 06/06/17  6:46 PM  Result Value Ref Range   Sodium 137 135 - 145 mmol/L   Potassium 3.9 3.5 - 5.1 mmol/L   Chloride 105 101 - 111 mmol/L   CO2 22 22 - 32 mmol/L   Glucose, Bld 95 65 - 99 mg/dL   BUN 14 6 - 20 mg/dL   Creatinine, Ser 1.18 0.61 - 1.24 mg/dL   Calcium 9.5 8.9 - 10.3 mg/dL   Total Protein 7.2 6.5 - 8.1 g/dL   Albumin 4.2 3.5 - 5.0 g/dL   AST 34 15 - 41 U/L   ALT 40 17 - 63 U/L   Alkaline Phosphatase 72 38 - 126 U/L   Total Bilirubin 0.7 0.3 - 1.2 mg/dL   GFR calc non Af Amer >60 >60 mL/min   GFR calc Af Amer >60 >60 mL/min    Comment: (NOTE) The eGFR has been calculated using the CKD  EPI equation. This calculation has not been validated in all clinical situations. eGFR's persistently <60 mL/min signify possible Chronic Kidney Disease.    Anion gap 10 5 - 15  CBC with Differential     Status: None   Collection Time: 06/06/17  6:46 PM  Result Value Ref Range   WBC 8.7 4.0 - 10.5 K/uL   RBC 4.65 4.22 - 5.81 MIL/uL   Hemoglobin 14.5 13.0 - 17.0 g/dL   HCT 16.7 42.5 - 52.5 %   MCV 91.4 78.0 - 100.0 fL   MCH 31.2 26.0 - 34.0 pg   MCHC 34.1 30.0 - 36.0 g/dL   RDW 89.4 83.4 - 75.8 %   Platelets 263 150 - 400 K/uL   Neutrophils Relative % 58 %   Neutro Abs 5.0 1.7 - 7.7 K/uL   Lymphocytes Relative 30 %   Lymphs Abs 2.6 0.7 - 4.0 K/uL   Monocytes Relative 8 %   Monocytes Absolute 0.7 0.1 - 1.0 K/uL   Eosinophils Relative 3 %   Eosinophils Absolute 0.2 0.0 - 0.7 K/uL   Basophils Relative 1 %   Basophils Absolute 0.1 0.0 - 0.1 K/uL  I-Stat CG4 Lactic Acid, ED      Status: None   Collection Time: 06/06/17  7:05 PM  Result Value Ref Range   Lactic Acid, Venous 1.61 0.5 - 1.9 mmol/L  C-reactive protein     Status: None   Collection Time: 06/06/17  8:31 PM  Result Value Ref Range   CRP <0.8 <1.0 mg/dL  Sedimentation rate     Status: None   Collection Time: 06/06/17  8:31 PM  Result Value Ref Range   Sed Rate 7 0 - 16 mm/hr  Blood culture (routine x 2)     Status: None (Preliminary result)   Collection Time: 06/06/17  8:40 PM  Result Value Ref Range   Specimen Description BLOOD RIGHT ARM    Special Requests      BOTTLES DRAWN AEROBIC AND ANAEROBIC Blood Culture adequate volume   Culture  Setup Time      AEROBIC BOTTLE ONLY GRAM POSITIVE COCCI IN CLUSTERS   Culture GRAM POSITIVE COCCI    Report Status PENDING   Blood Culture ID Panel (Reflexed)     Status: Abnormal   Collection Time: 06/06/17  8:40 PM  Result Value Ref Range   Enterococcus species NOT DETECTED NOT DETECTED   Vancomycin resistance NOT DETECTED NOT DETECTED   Listeria monocytogenes NOT DETECTED NOT DETECTED   Staphylococcus species (A) NOT DETECTED    CRITICAL RESULT CALLED TO, READ BACK BY AND VERIFIED WITH:    Comment: TO  GABBOTT(PHARMd) BY TCLEVELAND 06/08/2017 AT 3:52AM   Staphylococcus aureus NOT DETECTED NOT DETECTED   Methicillin resistance NOT DETECTED NOT DETECTED   Streptococcus species NOT DETECTED NOT DETECTED   Streptococcus agalactiae NOT DETECTED NOT DETECTED   Streptococcus pneumoniae NOT DETECTED NOT DETECTED   Streptococcus pyogenes NOT DETECTED NOT DETECTED   Acinetobacter baumannii NOT DETECTED NOT DETECTED   Enterobacteriaceae species NOT DETECTED NOT DETECTED   Enterobacter cloacae complex NOT DETECTED NOT DETECTED   Escherichia coli NOT DETECTED NOT DETECTED   Klebsiella oxytoca NOT DETECTED NOT DETECTED   Klebsiella pneumoniae NOT DETECTED NOT DETECTED   Proteus species NOT DETECTED NOT DETECTED   Serratia marcescens NOT DETECTED NOT DETECTED    Carbapenem resistance NOT DETECTED NOT DETECTED   Haemophilus influenzae NOT DETECTED NOT DETECTED   Neisseria meningitidis NOT DETECTED NOT DETECTED  Pseudomonas aeruginosa NOT DETECTED NOT DETECTED   Candida albicans NOT DETECTED NOT DETECTED   Candida glabrata NOT DETECTED NOT DETECTED   Candida krusei NOT DETECTED NOT DETECTED   Candida parapsilosis NOT DETECTED NOT DETECTED   Candida tropicalis NOT DETECTED NOT DETECTED  Blood culture (routine x 2)     Status: None (Preliminary result)   Collection Time: 06/06/17  8:55 PM  Result Value Ref Range   Specimen Description BLOOD RIGHT FOREARM    Special Requests      BOTTLES DRAWN AEROBIC AND ANAEROBIC Blood Culture adequate volume   Culture NO GROWTH < 12 HOURS    Report Status PENDING   Protime-INR     Status: None   Collection Time: 06/07/17 12:01 AM  Result Value Ref Range   Prothrombin Time 12.6 11.4 - 15.2 seconds   INR 0.95   APTT     Status: None   Collection Time: 06/07/17 12:01 AM  Result Value Ref Range   aPTT 29 24 - 36 seconds  Aerobic/Anaerobic Culture (surgical/deep wound)     Status: None (Preliminary result)   Collection Time: 06/07/17  1:41 PM  Result Value Ref Range   Specimen Description FLUID STERNOCLAVICULAR Norris LEFT    Special Requests NONE    Gram Stain NO WBC SEEN NO ORGANISMS SEEN     Culture NO GROWTH < 24 HOURS    Report Status PENDING   CBC     Status: None   Collection Time: 06/08/17  9:59 AM  Result Value Ref Range   WBC 6.6 4.0 - 10.5 K/uL   RBC 4.76 4.22 - 5.81 MIL/uL   Hemoglobin 14.8 13.0 - 17.0 g/dL   HCT 43.7 39.0 - 52.0 %   MCV 91.8 78.0 - 100.0 fL   MCH 31.1 26.0 - 34.0 pg   MCHC 33.9 30.0 - 36.0 g/dL   RDW 12.6 11.5 - 15.5 %   Platelets 251 150 - 400 K/uL  Basic metabolic panel     Status: None   Collection Time: 06/08/17  9:59 AM  Result Value Ref Range   Sodium 135 135 - 145 mmol/L   Potassium 4.2 3.5 - 5.1 mmol/L   Chloride 103 101 - 111 mmol/L   CO2 25 22 - 32 mmol/L    Glucose, Bld 99 65 - 99 mg/dL   BUN 13 6 - 20 mg/dL   Creatinine, Ser 1.12 0.61 - 1.24 mg/dL   Calcium 9.3 8.9 - 10.3 mg/dL   GFR calc non Af Amer >60 >60 mL/min   GFR calc Af Amer >60 >60 mL/min    Comment: (NOTE) The eGFR has been calculated using the CKD EPI equation. This calculation has not been validated in all clinical situations. eGFR's persistently <60 mL/min signify possible Chronic Kidney Disease.    Anion gap 7 5 - 15    Mr Chest W Wo Contrast  Result Date: 06/06/2017 CLINICAL DATA:  Intermittent left sternoclavicular Norris pain for 6 weeks. Focal swelling. EXAM: MRI CHEST WITHOUT AND WITH CONTRAST TECHNIQUE: Multiplanar multisequence MR images were obtained through the sternoclavicular joints before and after IV contrast. CONTRAST:  66m MULTIHANCE GADOBENATE DIMEGLUMINE 529 MG/ML IV SOLN COMPARISON:  05/29/2017 radiographs FINDINGS: Despite efforts by the technologist and patient, motion artifact is present on today's exam and could not be eliminated. This reduces exam sensitivity and specificity. There is anterior fusion of the sternoclavicular Norris associated with abnormal edema and enhancement particularly in the adjacent sternum and tracking caudad in  the sternum to the region of the first costosternal junction. There is also an effusion of the first costosternal junction. No definite abnormal enhancement in the distal clavicle. There is abnormal inflammation of the soft tissues of the mediastinum adjacent to the left first costosternal Norris as shown for example on image 5/10. The on assessment of the supraclavicular fossa region, no specific separate abnormality is identified. IMPRESSION: 1. Abnormal left sternoclavicular Norris effusion and also Norris effusion of the left first costosternal junction, with abnormal edema signal and enhancement within the adjacent sternal manubrium suspicious for of sternal osteomyelitis and septic Norris. Unilateral erosive arthropathy is  considered a less likely differential diagnostic consideration. Small amount of adjacent upper mediastinal enhancement compatible with adjacent phlegmon. These results will be called to the ordering clinician or representative by the Radiologist Assistant, and communication documented in the PACS or zVision Dashboard. Electronically Signed   By: Van Clines M.D.   On: 06/06/2017 15:04   US Aspiration  Result Date: 06/07/2017 INDICATION: 41 year old male with a history of possible left sternoclavicular septic Norris EXAM: ULTRASOUND-GUIDED ASPIRATION LEFT [place] Norris MEDICATIONS: None ANESTHESIA/SEDATION: None COMPLICATIONS: None PROCEDURE: Informed written consent was obtained from the patient after a thorough discussion of the procedural risks, benefits and alternatives. All questions were addressed. Maximal Sterile Barrier Technique was utilized including caps, mask, sterile gowns, sterile gloves, sterile drape, hand hygiene and skin antiseptic. A timeout was performed prior to the initiation of the procedure. Patient was positioned supine position on the ultrasound stretcher. Images of the left sternoclavicular Norris were performed with images stored sent to PACs. The patient is then prepped and draped in the usual sterile fashion. 1% lidocaine was used for local anesthesia. Two separate approach with longitudinal and transverse approach to the [facility] Norris were performed under ultrasound guidance. Aspirin was attempted. Scant amount of thin yellow fluid aspirated. Sample was sent the lab for analysis. Final image was stored. Sterile bandage was placed. Patient tolerated the procedure well and remained hemodynamically stable throughout. No complications were encountered and no significant blood loss IMPRESSION: Status post ultrasound-guided aspirin of left sternoclavicular Norris with sample sent the lab for analysis. Signed, Dulcy Fanny. Earleen Newport, DO [facility]  Radiology Electronically Signed   By: Corrie Mckusick D.O.   On: 06/07/2017 14:47    Review of Systems  Constitutional: Negative for chills, fever, malaise/fatigue and weight loss.  HENT: Negative for ear discharge, ear pain, hearing loss and tinnitus.   Eyes: Negative for blurred vision, double vision, photophobia and pain.  Respiratory: Negative for cough, sputum production and shortness of breath.   Cardiovascular: Positive for chest pain.  Gastrointestinal: Negative for abdominal pain, constipation, diarrhea, nausea and vomiting.  Genitourinary: Negative for dysuria, flank pain, frequency and urgency.  Musculoskeletal: Positive for Norris pain (Left [place] Norris). Negative for back pain, falls, myalgias and neck pain.  Skin: Negative for rash.  Neurological: Negative for dizziness, tingling, sensory change, focal weakness, loss of consciousness, weakness and headaches.  Endo/Heme/Allergies: Does not bruise/bleed easily.  Psychiatric/Behavioral: Negative for depression, memory loss and substance abuse. The patient is not nervous/anxious.    Blood pressure 123/63, pulse 63, temperature (!) 97.5 F (36.4 C), temperature source Oral, resp. rate 18, height '6\' 2"'$  (1.88 m), weight (!) 161.5 kg (356 lb), SpO2 100 %. Physical Exam  Constitutional: He appears well-developed and well-nourished. No distress.  HENT:  Head: Normocephalic.  Eyes: Conjunctivae are normal. Right eye exhibits no discharge. Left eye exhibits no  discharge. No scleral icterus.  Neck: Normal range of motion. Neck supple.  Cardiovascular: Normal rate and regular rhythm.   Respiratory: Effort normal. No respiratory distress.  Musculoskeletal:       Lumbar back: He exhibits no tenderness and no bony tenderness.  Bilateral shoulder, elbow, wrist, digits- no skin wounds, nontender, no instability, no blocks to motion. Mild swelling, TTP left [place] Norris.  Sens  Ax/R/M/U intact  Mot   Ax/ R/ PIN/ M/ AIN/ U intact  Rad 2+  BLE No  traumatic wounds, ecchymosis, or rash  Nontender    Lymphadenopathy:    He has no cervical adenopathy.  Neurological: He is alert.  Skin: Skin is warm and dry. He is not diaphoretic.  Psychiatric: He has a normal mood and affect. His behavior is normal.    Assessment/Plan: Left sternoclavicular arthropathy -- Given the complete lack of other markers for infectious osteomyelitis I think that can be safely ruled out for now. Should his condition worsen may revisit that as a possibility. This still leaves a broad differential that includes autoimmune (e.g. RA), malignant, and inflammatory (e.g. Gout or sternoclavicular hyperostosis) etiologies that seem to be equally unlikely. Will complete laboratory workup to include uric acid, ANA, RF, and CCP ab. Unfortunately there was not enough synovial fluid remaining to do crystal analysis. Surgical debridement not indicated at this time. Ok to discharge from orthopedic surgery standpoint, do not feel pt needs to limit activity. Can f/u with Dr. Marlou Sa in office in 2 weeks or so.    Lisette Abu, PA-C Orthopedic Surgery [phone] 06/08/2017, 12:58 PM   Patient has actually already left the hospital.  I will see him back in a week instead of 2 weeks..  Review of the chart indicates likelihood of infectious sternoclavicular problem is possible but unlikelyThis could be degenerative or related to gout or pseudogout.  Review of the MRI scan does demonstrate fluid in the Norris with edema primarily on the sternal aspect of the Norris.  We will follow-up and reevaluate in 1 week's time.

## 2017-06-08 NOTE — ED Provider Notes (Signed)
[facility] Provider Note   CSN: [id number] Arrival date & time: 06/06/17  1839     History   Chief Complaint Chief Complaint  Patient presents with  . Osteomyelitis     HPI Scott Norris is a 41 y.o. male.  HPI 41 y.o. male with no significant medical hx comes in with cc of abnormal MRI. Pt has hx of left clavicular pain for the past 6 weeks, and the PCP had ordered MRI which came back concerning for osteomyelitis vs. septic arthritis. Pt states the area about his left clavicle is intermittently erythematous and swollen. He notices sharp pain when rotating his left arm, as well in certain positions. Pt denies any fever, chills, nightsweats, weight loss, rashm skin breakdown over the neck,  any dental procedure, or hx of ivdu.   Past Medical History:  Diagnosis Date  . Family history of prostate cancer    Father  . GERD (gastroesophageal reflux disease)   . History of chest pain 09/2013   Ruled out for ACS at [facility] ED.  Also dx'd with musculoskeletal CP, costochondritis, and GERD for c/o CP in the past.  ETT normal 06/2015.  Dr. Rennis Golden eval 11/2016--plan for cardiac CT  . Hypertriglyceridemia    Trigs>1400 in 2012: lovaza rx'd  . Morbid obesity (HCC) 12/2014   BMI 49  . OSA on CPAP     Patient Active Problem List   Diagnosis Date Noted  . OSA treated with BiPAP 06/07/2017  . 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 Family history of early CAD 12/09/2016  . Epigastric discomfort 12/20/2015  . Chest pain 11/22/2015  . GERD (gastroesophageal reflux disease) 11/22/2015  . ROTATOR CUFF SYNDROME, LEFT 01/07/2010    Past Surgical History:  Procedure Laterality Date  . ESOPHAGOGASTRODUODENOSCOPY     Normal 12/2015 [facility] per pt.  Marland Kitchen ETT  06/2015   Normal  . KNEE SURGERY Right    Arthroscopic  . LASIK  2007  . WRIST SURGERY     Fractured wrist; no hardware in wrist.       Home Medications    Prior to Admission medications   Medication Sig Start Date  End Date Taking? Authorizing Provider  cetirizine (ZYRTEC) 10 MG tablet Take 10 mg by mouth daily as needed (seasonal allergies).   Yes [provider]  ibuprofen (ADVIL,MOTRIN) 200 MG tablet Take 800 mg by mouth daily as needed for headache (pain).   Yes [provider]  PRESCRIPTION MEDICATION Inhale into the lungs at bedtime. CPAP   Yes [provider]    Family History Family History  Problem Relation Age of Onset  . Prostate cancer Father 27  . Hypertension Father     Social History Social History  Substance Use Topics  . Smoking status: Never Smoker  . Smokeless tobacco: Former Neurosurgeon    Types: Snuff  . Alcohol use 1.2 oz/week    2 Shots of liquor per week     Comment: 4-5 weekly     Allergies   Naproxen   Review of Systems Review of Systems  Constitutional: Negative for fever.  Musculoskeletal: Positive for arthralgias.  Skin: Positive for rash.  Allergic/Immunologic: Negative for immunocompromised state.  All other systems reviewed and are negative.    Physical Exam Updated Vital Signs BP 123/63 (BP Location: Right Arm)   Pulse 63   Temp (!) 97.5 F (36.4 C) (Oral)   Resp 18   Ht  (1.88 m)   Wt Marland Kitchen)  161.5 kg (356 lb)   SpO2 100%   BMI 45.71 kg/m   Physical Exam  Constitutional: He is oriented to person, place, and time. He appears well-developed.  HENT:  Head: Atraumatic.  Neck: Neck supple.  Cardiovascular: Normal rate.   Pulmonary/Chest: Effort normal.  Musculoskeletal: He exhibits edema.  L [place] joint edema and erythema without tenderness  Neurological: He is alert and oriented to person, place, and time.  Skin: Skin is warm.  Nursing note and vitals reviewed.    ED Treatments / Results  Labs (all labs ordered are listed, but only abnormal results are displayed) Labs Reviewed  BLOOD CULTURE ID PANEL (REFLEXED) - Abnormal; Notable for the following:       Result Value   Staphylococcus species   (*)     Value: CRITICAL RESULT CALLED TO, READ BACK BY AND VERIFIED WITH:   All other components within normal limits  CULTURE, BLOOD (ROUTINE X 2)  CULTURE, BLOOD (ROUTINE X 2)  AEROBIC/ANAEROBIC CULTURE (SURGICAL/DEEP WOUND)  CULTURE, BLOOD (ROUTINE X 2)  CULTURE, BLOOD (ROUTINE X 2)  COMPREHENSIVE METABOLIC PANEL  CBC WITH DIFFERENTIAL/PLATELET  C-REACTIVE PROTEIN  SEDIMENTATION RATE  PROTIME-INR  APTT  HIV ANTIBODY (ROUTINE TESTING)  I-STAT CG4 LACTIC ACID, ED    EKG  EKG Interpretation None       Radiology Mr Chest W Wo Contrast  Result Date: 06/06/2017 CLINICAL DATA:  Intermittent left sternoclavicular joint pain for 6 weeks. Focal swelling. EXAM: MRI CHEST WITHOUT AND WITH CONTRAST TECHNIQUE: Multiplanar multisequence MR images were obtained through the sternoclavicular joints before and after IV contrast. CONTRAST:  20mL MULTIHANCE GADOBENATE DIMEGLUMINE 529 MG/ML IV SOLN COMPARISON:  05/29/2017 radiographs FINDINGS: Despite efforts by the technologist and patient, motion artifact is present on today's exam and could not be eliminated. This reduces exam sensitivity and specificity. There is anterior fusion of the sternoclavicular joint associated with abnormal edema and enhancement particularly in the adjacent sternum and tracking caudad in the sternum to the region of the first costosternal junction. There is also an effusion of the first costosternal junction. No definite abnormal enhancement in the distal clavicle. There is abnormal inflammation of the soft tissues of the mediastinum adjacent to the left first costosternal joint as shown for example on image 5/10. The on assessment of the supraclavicular fossa region, no specific separate abnormality is identified. IMPRESSION: 1. Abnormal left sternoclavicular joint effusion and also joint effusion of the left first costosternal junction, with abnormal edema signal and enhancement within the adjacent sternal manubrium suspicious for of  sternal osteomyelitis and septic joint. Unilateral erosive arthropathy is considered a less likely differential diagnostic consideration. Small amount of adjacent upper mediastinal enhancement compatible with adjacent phlegmon. These results will be called to the ordering clinician or representative by the Radiologist Assistant, and communication documented in the PACS or zVision Dashboard. Electronically Signed   By: Gaylyn Rong M.D.   On: 06/06/2017 15:04   US Aspiration  Result Date: 06/07/2017 INDICATION: 41 year old male with a history of possible left sternoclavicular septic joint EXAM: ULTRASOUND-GUIDED ASPIRATION LEFT [place] JOINT MEDICATIONS: None ANESTHESIA/SEDATION: None COMPLICATIONS: None PROCEDURE: Informed written consent was obtained from the patient after a thorough discussion of the procedural risks, benefits and alternatives. All questions were addressed. Maximal Sterile Barrier Technique was utilized including caps, mask, sterile gowns, sterile gloves, sterile drape, hand hygiene and skin antiseptic. A timeout was performed prior to the initiation of the procedure. Patient was positioned supine position on the ultrasound stretcher. Images of  the left sternoclavicular joint were performed with images stored sent to PACs. The patient is then prepped and draped in the usual sterile fashion. 1% lidocaine was used for local anesthesia. Two separate approach with longitudinal and transverse approach to the [facility] joint were performed under ultrasound guidance. Aspirin was attempted. Scant amount of thin yellow fluid aspirated. Sample was sent the lab for analysis. Final image was stored. Sterile bandage was placed. Patient tolerated the procedure well and remained hemodynamically stable throughout. No complications were encountered and no significant blood loss IMPRESSION: Status post ultrasound-guided aspirin of left sternoclavicular joint with sample sent the lab for analysis. Signed, Yvone Neu.  Loreta Ave, DO [facility] Electronically Signed   By: Gilmer Mor D.O.   On: 06/07/2017 14:47    Procedures Procedures (including critical care time)  Medications Ordered in ED Medications  loratadine (CLARITIN) tablet 10 mg (not administered)  ondansetron (ZOFRAN) tablet 4 mg (not administered)    Or  ondansetron (ZOFRAN) injection 4 mg (not administered)  acetaminophen (TYLENOL) tablet 650 mg (not administered)    Or  acetaminophen (TYLENOL) suppository 650 mg (not administered)  ibuprofen (ADVIL,MOTRIN) tablet 400 mg (not administered)     Initial Impression / Assessment and Plan / ED Course  I have reviewed the triage vital signs and the nursing notes.  Pertinent labs & imaging results that were available during my care of the patient were reviewed by me and considered in my medical decision making (see chart for details).    Pt with no concerning medical hx or any significant risk factors for severe infection comes in with MRI of the chest for [place] joint redness that is concerning for inflammatory process vs. Infectious process. Osteo and septic arthritis in the ddx. Cancer nad inflammatory conditions also possible. Endocarditis also considered.  Spoke with Dr. Drue Second, ID who will see the patient tomorrow AM. She recommends admission, blood cultures now and blood cultures repeated in the morning and no antibiotics.    Final Clinical Impressions(s) / ED Diagnoses   Final diagnoses:  Sternal osteomyelitis (HCC)  Septic joint [facility])    New Prescriptions Current Discharge Medication List       Derwood Kaplan, MD 06/08/17 [phone]

## 2017-06-08 NOTE — Discharge Summary (Signed)
Physician Discharge Summary  Scott Norris ZOX:[id number] DOB: 11-Nov-1975 DOA: 06/06/2017  PCP: Jeoffrey Massed, MD  Admit date: 06/06/2017 Discharge date: 06/08/2017   Recommendations for Outpatient Follow-Up:   1. Follow Blood cultures to final 2. Follow up with Dr. August Saucer (ortho) 3. Labs pending: ANA, RF, cyclic citrul, HIV   Discharge Diagnosis:   Principal Problem:   Sternal osteomyelitis (HCC) Active Problems:   OSA treated with BiPAP   Discharge disposition:  Home.  SNF:  Discharge Condition: Improved.  Diet recommendation: Regular.  Wound care: None.   History of Present Illness:   Scott Norris is a 41 y.o. male with medical history significant of morbid obesity, and OSA on BiPAP; who presents after being instructed to come to the emergency department for abnormal MRI results. Patient notes that over the last 5-6 weeks he's been feeling with intermittent left clavicle pain and swelling that comes and goes. Patient has used over-the-counter NSAIDs as needed for discomfort. Patient describes a sharp nagging-like pain that can be provoked or worsened with certain movements. The area can sometimes become reddened and inflamed, but goes down with time. Denies any fevers, nausea, vomiting, weight loss, bites, or surgeries to that area. He had been going back and forth to his primary care physician. Out what was going on an MRI of the chest was obtained which showed abnormal sternoclavicular joint effusion suspicious for osteomyelitis and septic joint. He was advised to come to the emergency department for further evaluation.  Lastly, patient notes complaints of intermittent episodes of chest tightness that have been going on for the last 1-2 years. Tenderness is located around the left lateral nipple line on it occurs. He's had multiple evaluations regarding the symptoms including negative exercise stress test in 2016 and negative nuclear stress test in 12/2016. No cause  can be found to his symptoms and he wonders if the clavicle has something to do with it.    Hospital Course by Problem:   Left sternoclavicular arthropathy  -per ortho: infectious osteomyelitis can be safely ruled out for now. Should his condition worsen may revisit that as a possibility (1/4 blood culture positive for contaminant-- 3/4 NGTD) -sed rate/CRP negative -broad differential: autoimmune (e.g. RA), malignancy, and inflammatory (e.g. Gout or sternoclavicular hyperostosis) etiologies that seem to be equally unlikely -uric acid negative, ANA, RF, and CCP ab pending.  -not enough synovial fluid remaining to do crystal analysis -Surgical debridement not indicated at this time. -discharge to f/u with Dr. August Saucer in office in 2 weeks or so.     Medical Consultants:    Ortho  ID  IR.   Discharge Exam:   Vitals:   06/08/17 0500 06/08/17 1500  BP: 123/63 135/78  Pulse: 63 82  Resp: 18 18  Temp: (!) 97.5 F (36.4 C) 97.8 F (36.6 C)  SpO2: 100% 98%   Vitals:   06/07/17 1525 06/07/17 1900 06/08/17 0500 06/08/17 1500  BP: 133/80 126/62 123/63 135/78  Pulse: 94 78 63 82  Resp: Temp: 98.3 F (36.8 C) 98.2 F (36.8 C) (!) 97.5 F (36.4 C) 97.8 F (36.6 C)  TempSrc: Oral Oral Oral Oral  SpO2: 97% 95% 100% 98%  Weight:      Height:        Gen:  NAD   The results of significant diagnostics from this hospitalization (including imaging, microbiology, ancillary and laboratory) are listed below for reference.     Procedures and Diagnostic Studies:  Mr Chest W Wo Contrast  Result Date: 06/06/2017 CLINICAL DATA:  Intermittent left sternoclavicular joint pain for 6 weeks. Focal swelling. EXAM: MRI CHEST WITHOUT AND WITH CONTRAST TECHNIQUE: Multiplanar multisequence MR images were obtained through the sternoclavicular joints before and after IV contrast. CONTRAST:  20mL MULTIHANCE GADOBENATE DIMEGLUMINE 529 MG/ML IV SOLN COMPARISON:  05/29/2017 radiographs  FINDINGS: Despite efforts by the technologist and patient, motion artifact is present on today's exam and could not be eliminated. This reduces exam sensitivity and specificity. There is anterior fusion of the sternoclavicular joint associated with abnormal edema and enhancement particularly in the adjacent sternum and tracking caudad in the sternum to the region of the first costosternal junction. There is also an effusion of the first costosternal junction. No definite abnormal enhancement in the distal clavicle. There is abnormal inflammation of the soft tissues of the mediastinum adjacent to the left first costosternal joint as shown for example on image 5/10. The on assessment of the supraclavicular fossa region, no specific separate abnormality is identified. IMPRESSION: 1. Abnormal left sternoclavicular joint effusion and also joint effusion of the left first costosternal junction, with abnormal edema signal and enhancement within the adjacent sternal manubrium suspicious for of sternal osteomyelitis and septic joint. Unilateral erosive arthropathy is considered a less likely differential diagnostic consideration. Small amount of adjacent upper mediastinal enhancement compatible with adjacent phlegmon. These results will be called to the ordering clinician or representative by the Radiologist Assistant, and communication documented in the PACS or zVision Dashboard. Electronically Signed   By: Gaylyn Rong M.D.   On: 06/06/2017 15:04   US Aspiration  Result Date: 06/07/2017 INDICATION: 41 year old male with a history of possible left sternoclavicular septic joint EXAM: ULTRASOUND-GUIDED ASPIRATION LEFT [place] JOINT MEDICATIONS: None ANESTHESIA/SEDATION: None COMPLICATIONS: None PROCEDURE: Informed written consent was obtained from the patient after a thorough discussion of the procedural risks, benefits and alternatives. All questions were addressed. Maximal Sterile Barrier Technique was utilized  including caps, mask, sterile gowns, sterile gloves, sterile drape, hand hygiene and skin antiseptic. A timeout was performed prior to the initiation of the procedure. Patient was positioned supine position on the ultrasound stretcher. Images of the left sternoclavicular joint were performed with images stored sent to PACs. The patient is then prepped and draped in the usual sterile fashion. 1% lidocaine was used for local anesthesia. Two separate approach with longitudinal and transverse approach to the [facility] joint were performed under ultrasound guidance. Aspirin was attempted. Scant amount of thin yellow fluid aspirated. Sample was sent the lab for analysis. Final image was stored. Sterile bandage was placed. Patient tolerated the procedure well and remained hemodynamically stable throughout. No complications were encountered and no significant blood loss IMPRESSION: Status post ultrasound-guided aspirin of left sternoclavicular joint with sample sent the lab for analysis. Signed, Yvone Neu. Loreta Ave, DO [facility] Electronically Signed   By: Gilmer Mor D.O.   On: 06/07/2017 14:47     Labs:   Basic Metabolic Panel:  Recent Labs Lab 06/06/17 1846 06/08/17 0959  NA 137 135  K 3.9 4.2  CL 105 103  CO2 22 25  GLUCOSE 95 99  BUN 14 13  CREATININE 1.18 1.12  CALCIUM 9.5 9.3   GFR Estimated Creatinine Clearance: 141.2 mL/min (by C-G formula based on SCr of 1.12 mg/dL). Liver Function Tests:  Recent Labs Lab 06/06/17 1846  AST 34  ALT 40  ALKPHOS 72  BILITOT 0.7  PROT 7.2  ALBUMIN 4.2  No results for input(s): LIPASE, AMYLASE in the last 168 hours. No results for input(s): AMMONIA in the last 168 hours. Coagulation profile  Recent Labs Lab 06/07/17 0001  INR 0.95    CBC:  Recent Labs Lab 06/06/17 1846 06/08/17 0959  WBC 8.7 6.6  NEUTROABS 5.0  --   HGB 14.5 14.8  HCT 42.5 43.7  MCV 91.4 91.8  PLT 263 251    Cardiac Enzymes: No results for input(s): CKTOTAL, CKMB, CKMBINDEX, TROPONINI in the last 168 hours. BNP: Invalid input(s): POCBNP CBG: No results for input(s): GLUCAP in the last 168 hours. D-Dimer No results for input(s): DDIMER in the last 72 hours. Hgb A1c No results for input(s): HGBA1C in the last 72 hours. Lipid Profile No results for input(s): CHOL, HDL, LDLCALC, TRIG, CHOLHDL, LDLDIRECT in the last 72 hours. Thyroid function studies No results for input(s): TSH, T4TOTAL, T3FREE, THYROIDAB in the last 72 hours.  Invalid input(s): FREET3 Anemia work up No results for input(s): VITAMINB12, FOLATE, FERRITIN, TIBC, IRON, RETICCTPCT in the last 72 hours. Microbiology Recent Results (from the past 240 hour(s))  Blood culture (routine x 2)     Status: Abnormal (Preliminary result)   Collection Time: 06/06/17  8:40 PM  Result Value Ref Range Status   Specimen Description BLOOD RIGHT ARM  Final   Special Requests   Final    BOTTLES DRAWN AEROBIC AND ANAEROBIC Blood Culture adequate volume   Culture  Setup Time   Final    AEROBIC BOTTLE ONLY GRAM POSITIVE COCCI IN CLUSTERS   Culture STAPHYLOCOCCUS SPECIES (COAGULASE NEGATIVE) (A)  Final   Report Status PENDING  Incomplete  Blood Culture ID Panel (Reflexed)     Status: Abnormal   Collection Time: 06/06/17  8:40 PM  Result Value Ref Range Status   Enterococcus species NOT DETECTED NOT DETECTED Final   Vancomycin resistance NOT DETECTED NOT DETECTED Final   Listeria monocytogenes NOT DETECTED NOT DETECTED Final   Staphylococcus species (A) NOT DETECTED Final    CRITICAL RESULT CALLED TO, READ BACK BY AND VERIFIED WITH:    Comment: TO  GABBOTT(PHARMd) BY TCLEVELAND 06/08/2017 AT 3:52AM   Staphylococcus aureus NOT DETECTED NOT DETECTED Final   Methicillin resistance NOT DETECTED NOT DETECTED Final   Streptococcus species NOT DETECTED NOT DETECTED Final   Streptococcus agalactiae NOT DETECTED NOT DETECTED Final   Streptococcus  pneumoniae NOT DETECTED NOT DETECTED Final   Streptococcus pyogenes NOT DETECTED NOT DETECTED Final   Acinetobacter baumannii NOT DETECTED NOT DETECTED Final   Enterobacteriaceae species NOT DETECTED NOT DETECTED Final   Enterobacter cloacae complex NOT DETECTED NOT DETECTED Final   Escherichia coli NOT DETECTED NOT DETECTED Final   Klebsiella oxytoca NOT DETECTED NOT DETECTED Final   Klebsiella pneumoniae NOT DETECTED NOT DETECTED Final   Proteus species NOT DETECTED NOT DETECTED Final   Serratia marcescens NOT DETECTED NOT DETECTED Final   Carbapenem resistance NOT DETECTED NOT DETECTED Final   Haemophilus influenzae NOT DETECTED NOT DETECTED Final   Neisseria meningitidis NOT DETECTED NOT DETECTED Final   Pseudomonas aeruginosa NOT DETECTED NOT DETECTED Final   Candida albicans NOT DETECTED NOT DETECTED Final   Candida glabrata NOT DETECTED NOT DETECTED Final   Candida krusei NOT DETECTED NOT DETECTED Final   Candida parapsilosis NOT DETECTED NOT DETECTED Final   Candida tropicalis NOT DETECTED NOT DETECTED Final  Blood culture (routine x 2)     Status: None (Preliminary result)   Collection Time: 06/06/17  8:55 PM  Result Value Ref Range Status   Specimen Description BLOOD RIGHT FOREARM  Final   Special Requests   Final    BOTTLES DRAWN AEROBIC AND ANAEROBIC Blood Culture adequate volume   Culture NO GROWTH 2 DAYS  Final   Report Status PENDING  Incomplete  Culture, blood (routine x 2)     Status: None (Preliminary result)   Collection Time: 06/07/17  8:09 AM  Result Value Ref Range Status   Specimen Description BLOOD RIGHT HAND  Final   Special Requests IN PEDIATRIC BOTTLE Blood Culture adequate volume  Final   Culture NO GROWTH 1 DAY  Final   Report Status PENDING  Incomplete  Culture, blood (routine x 2)     Status: None (Preliminary result)   Collection Time: 06/07/17  8:13 AM  Result Value Ref Range Status   Specimen Description BLOOD LEFT ARM  Final   Special Requests  IN PEDIATRIC BOTTLE Blood Culture adequate volume  Final   Culture NO GROWTH 1 DAY  Final   Report Status PENDING  Incomplete  Aerobic/Anaerobic Culture (surgical/deep wound)     Status: None (Preliminary result)   Collection Time: 06/07/17  1:41 PM  Result Value Ref Range Status   Specimen Description FLUID STERNOCLAVICULAR JOINT LEFT  Final   Special Requests NONE  Final   Gram Stain NO WBC SEEN NO ORGANISMS SEEN   Final   Culture NO GROWTH < 24 HOURS  Final   Report Status PENDING  Incomplete     Discharge Instructions:   Discharge Instructions    Call MD for:  temperature >100.4    Complete by:  As directed    Diet general    Complete by:  As directed    Increase activity slowly    Complete by:  As directed      Allergies as of 06/08/2017      Reactions   Naproxen Nausea And Vomiting, Other (See Comments)   Stomach pain      Medication List    TAKE these medications   cetirizine 10 MG tablet Commonly known as:  ZYRTEC Take 10 mg by mouth daily as needed (seasonal allergies).   ibuprofen 200 MG tablet Commonly known as:  ADVIL,MOTRIN Take 800 mg by mouth daily as needed for headache (pain).   PRESCRIPTION MEDICATION Inhale into the lungs at bedtime. CPAP            Discharge Care Instructions        Start     Ordered   06/08/17 0000  Increase activity slowly     06/08/17 1635   06/08/17 0000  Diet general     06/08/17 1635   06/08/17 0000  Call MD for:  temperature >100.4     06/08/17 1635     Follow-up Information    McGowen, Maryjean Morn, MD Follow up in 1 week(s).   Specialty:  Family Medicine Contact information: [address] [place] [address] [phone]        Cammy Copa, MD Follow up in 1 week(s).   Specialty:  Orthopedic Surgery Contact information: [address] [place] [address] [phone]            Time coordinating discharge: 35 min  Signed:  Kyria Bumgardner Juanetta Gosling   [facility] 06/08/2017, 4:36 PM

## 2017-06-08 NOTE — Telephone Encounter (Signed)
Reviewed chart. On-call MD sent pt to ED and he was admitted.  Signed:  Santiago Bumpers, MD           06/08/2017

## 2017-06-08 NOTE — Telephone Encounter (Signed)
Opened in error

## 2017-06-09 ENCOUNTER — Telehealth: Payer: Self-pay

## 2017-06-09 ENCOUNTER — Encounter: Payer: Self-pay | Admitting: Family Medicine

## 2017-06-09 LAB — CULTURE, BLOOD (ROUTINE X 2): Special Requests: ADEQUATE

## 2017-06-09 LAB — RHEUMATOID FACTOR: Rhuematoid fact SerPl-aCnc: <10 IU/mL (ref 0.0–13.9)

## 2017-06-09 LAB — CYCLIC CITRUL PEPTIDE ANTIBODY, IGG/IGA: CCP ANTIBODIES IGG/IGA: 10 U (ref 0–19)

## 2017-06-09 LAB — ANTINUCLEAR ANTIBODIES, IFA: ANA Ab, IFA: NEGATIVE

## 2017-06-09 LAB — HIV ANTIBODY (ROUTINE TESTING W REFLEX): HIV Screen 4th Generation wRfx: NONREACTIVE

## 2017-06-09 NOTE — Telephone Encounter (Signed)
Noted: TCM nurse contact 06/09/17.

## 2017-06-09 NOTE — Telephone Encounter (Signed)
Transition Care Management Follow-up Telephone Call   Date discharged? 06/08/17   How have you been since you were released from the hospital? "I'm fine"   Do you understand why you were in the hospital? yes, "they thought my joint was infected"   Do you understand the discharge instructions? yes   Where were you discharged to? Home.    Items Reviewed:  Medications reviewed: yes  Allergies reviewed: yes  Dietary changes reviewed: yes  Referrals reviewed: yes   Functional Questionnaire:   Activities of Daily Living (ADLs):   He states they are independent in the following: ambulation, bathing and hygiene, feeding, continence, grooming, toileting and dressing States they require assistance with the following: None   Any transportation issues/concerns?: no   Any patient concerns? no   Confirmed importance and date/time of follow-up visits scheduled yes  Provider Appointment booked with PCP 06/15/17 @ 11.   Confirmed with patient if condition begins to worsen call PCP or go to the ER.  Patient was given the office number and encouraged to call back with question or concerns.  : yes

## 2017-06-09 NOTE — Telephone Encounter (Signed)
LM requesting call back to complete TCM and schedule hospital f/u.  

## 2017-06-11 LAB — CULTURE, BLOOD (ROUTINE X 2)
Culture: NO GROWTH
Special Requests: ADEQUATE

## 2017-06-12 LAB — CULTURE, BLOOD (ROUTINE X 2)
Culture: NO GROWTH
Culture: NO GROWTH
SPECIAL REQUESTS: ADEQUATE
SPECIAL REQUESTS: ADEQUATE

## 2017-06-12 LAB — AEROBIC/ANAEROBIC CULTURE (SURGICAL/DEEP WOUND)
CULTURE: NO GROWTH
GRAM STAIN: NONE SEEN

## 2017-06-12 LAB — AEROBIC/ANAEROBIC CULTURE W GRAM STAIN (SURGICAL/DEEP WOUND)

## 2017-06-15 ENCOUNTER — Ambulatory Visit (INDEPENDENT_AMBULATORY_CARE_PROVIDER_SITE_OTHER): Payer: Managed Care, Other (non HMO) | Admitting: Family Medicine

## 2017-06-15 ENCOUNTER — Encounter: Payer: Self-pay | Admitting: Family Medicine

## 2017-06-15 VITALS — BP 122/75 | HR 78 | Temp 98.6°F | Resp 16 | Ht 74.0 in | Wt 354.8 lb

## 2017-06-15 DIAGNOSIS — M1990 Unspecified osteoarthritis, unspecified site: Secondary | ICD-10-CM

## 2017-06-15 DIAGNOSIS — R938 Abnormal findings on diagnostic imaging of other specified body structures: Secondary | ICD-10-CM | POA: Diagnosis not present

## 2017-06-15 DIAGNOSIS — M25512 Pain in left shoulder: Secondary | ICD-10-CM | POA: Diagnosis not present

## 2017-06-15 DIAGNOSIS — Z23 Encounter for immunization: Secondary | ICD-10-CM

## 2017-06-15 DIAGNOSIS — R936 Abnormal findings on diagnostic imaging of limbs: Secondary | ICD-10-CM

## 2017-06-15 MED ORDER — COLCHICINE 0.6 MG PO TABS: ORAL_TABLET | ORAL | 0 refills | Status: DC

## 2017-06-15 NOTE — Progress Notes (Addendum)
06/15/2017  CC:  Chief Complaint  Patient presents with  . Hospitalization Follow-up    TCM    Patient is a 41 y.o. Caucasian male who presents for  hospital follow up, specifically [facility] face-to-face visit. Dates hospitalized: 9/15-9/17, 2018. Days since d/c from hospital: 7 Patient was discharged from hospital to home. Reason for admission to hospital: suspected sternal, L sternoclavicular osteomyelitis/septic arthritis. Date of interactive (phone) contact with patient and/or caregiver: 06/09/17.  I have reviewed patient's discharge summary plus pertinent specific notes, labs, and imaging from the hospitalization.   Left sternoclavicular arthropathy: per ortho, infectious osteomyelitis can safely be ruled out now. One of 4 blood cultures was +, suspected contaminant.  [place] Jt aspiration culture: neg.  Still feels like the [place] joint on L is on/off tender and on/off "puffy" since d/c. No fevers, no malaise.  No other joint pain, swelling, or redness.  Pain worse when he moves left shoulder/arm much.   Medication reconciliation was done today and patient is taking meds as recommended by discharging hospitalist/specialist.    PMH:  Past Medical History:  Diagnosis Date  . Family history of prostate cancer    Father  . GERD (gastroesophageal reflux disease)   . History of chest pain 09/2013   Ruled out for ACS at [facility] ED.  Also dx'd with musculoskeletal CP, costochondritis, and GERD for c/o CP in the past.  ETT normal 06/2015.  Dr. Rennis Golden eval 11/2016--plan for cardiac CT  . Hypertriglyceridemia    Trigs>1400 in 2012: lovaza rx'd  . Morbid obesity (HCC) 12/2014   BMI 49  . OSA on CPAP   . Sternoclavicular joint pain, left 05/2017   w/intermittent swelling: admitted 05/2017 for MRI result suggestive of possible osteomyelitis of left [place] joint: joint fluid analysis did not support dx of infection.  Some autoimmune/rheum labs pending.  ? Gout---to f/u with ortho.     PSH:  Past Surgical History:  Procedure Laterality Date  . ESOPHAGOGASTRODUODENOSCOPY     Normal 12/2015 [facility] per pt.  Marland Kitchen ETT  06/2015   Normal  . KNEE SURGERY Right    Arthroscopic  . LASIK  2007  . WRIST SURGERY     Fractured wrist; no hardware in wrist.    MEDS:  Outpatient Medications Prior to Visit  Medication Sig Dispense Refill  . cetirizine (ZYRTEC) 10 MG tablet Take 10 mg by mouth daily as needed (seasonal allergies).    Marland Kitchen ibuprofen (ADVIL,MOTRIN) 200 MG tablet Take 800 mg by mouth daily as needed for headache (pain).    Marland Kitchen PRESCRIPTION MEDICATION Inhale into the lungs at bedtime. CPAP     No facility-administered medications prior to visit.    EXAM: BP 122/75 (BP Location: Left Arm, Patient Position: Sitting, Cuff Size: Large)   Pulse 78   Temp 98.6 F (37 C) (Oral)   Resp 16   Ht  (1.88 m)   Wt (!) 354 lb 12 oz (160.9 kg)   SpO2 95%   BMI 45.55 kg/m  Gen: Alert, well appearing.  Patient is oriented to person, place, time, and situation. AFFECT: pleasant, lucid thought and speech. WUJ:WJXB: no injection, icteris, swelling, or exudate.  EOMI, PERRLA. Mouth: lips without lesion/swelling.  Oral mucosa pink and moist. Oropharynx without erythema, exudate, or swelling.  Neck - No masses or thyromegaly or limitation in range of motion Left clavicle, [place] joint, and sternum all w/out tenderness or any palpable swelling or mass.  No erythema. CV: RRR, no m/r/g.  LUNGS: CTA bilat, nonlabored resps, good aeration in all lung fields. ABD: soft, NT/ND EXT: no clubbing, cyanosis, or edema.  Skin - no sores or suspicious lesions or rashes or color changes  Pertinent labs/imaging Lab Results  Component Value Date   TSH 1.93 09/05/2016   Lab Results  Component Value Date   WBC 6.6 06/08/2017   HGB 14.8 06/08/2017   HCT 43.7 06/08/2017   MCV 91.8 06/08/2017   PLT 251 06/08/2017   Lab Results  Component Value Date   CREATININE 1.12 06/08/2017   BUN 13  06/08/2017   NA 135 06/08/2017   K 4.2 06/08/2017   CL 103 06/08/2017   CO2 25 06/08/2017  GFR 06/08/17= > 60 ml/min  Lab Results  Component Value Date   ALT 40 06/06/2017   AST 34 06/06/2017   ALKPHOS 72 06/06/2017   BILITOT 0.7 06/06/2017   Lab Results  Component Value Date   CHOL 186 09/05/2016   Lab Results  Component Value Date   HDL 39.90 09/05/2016   Lab Results  Component Value Date   LDLCALC 119 (H) 09/05/2016   Lab Results  Component Value Date   TRIG 139.0 09/05/2016   Lab Results  Component Value Date   CHOLHDL 5 09/05/2016   Lab Results  Component Value Date   ANA Negative 06/08/2017   Lab Results  Component Value Date   ANA Negative 06/08/2017   RF <10.0 06/08/2017   CCP 06/08/17 was negative. Lab Results  Component Value Date   LABURIC 5.4 06/08/2017    ASSESSMENT/PLAN:  Left sternoclavicular arthropathy, abnormal MRI---septic arthritis/osteomyelitis effectively ruled out. Not enough fluid obtained for crystal analysis. Plan is to treat as atypical presentation of gout: stop ibuprofen.  Start colchicine 0.6mg  bid.  Therapeutic expectations and side effect profile of medication discussed today.  Patient's questions answered. Will also order full body bone scan. If no response to colchicine and if bone scan neg for any other areas of increasesd uptake besides L  joint, then will refer pt to rheumatology for further evaluation.  Flu vaccine given to patient today.  An After Visit Summary was printed and given to the patient.  Medical decision making of high complexity was utilized today.  FOLLOW UP:  2 weeks.  Signed:  Santiago Bumpers, MD           06/15/2017

## 2017-06-18 ENCOUNTER — Ambulatory Visit (INDEPENDENT_AMBULATORY_CARE_PROVIDER_SITE_OTHER): Payer: Managed Care, Other (non HMO) | Admitting: Orthopedic Surgery

## 2017-06-18 ENCOUNTER — Encounter (INDEPENDENT_AMBULATORY_CARE_PROVIDER_SITE_OTHER): Payer: Self-pay | Admitting: Orthopedic Surgery

## 2017-06-18 DIAGNOSIS — M19012 Primary osteoarthritis, left shoulder: Secondary | ICD-10-CM | POA: Diagnosis not present

## 2017-06-20 NOTE — Progress Notes (Signed)
Office Visit Note   Patient: Scott Norris           Date of Birth: 04/23/1976           MRN: [id number] Visit Date: 06/18/2017 Requested by: Jeoffrey Massed, MD [address] [place], [place] [address] PCP: Jeoffrey Massed, MD  Subjective: Chief Complaint  Patient presents with  . Left Shoulder - Pain    HPI: Scott Norris is a 41 year old patient with left sternoclavicular joint pain.  Patient was actually hospitalized for this problem week ago.  MRI scan suggested possible osteomyelitis.  Patient was placed on gout medication and improved.  He has never had any antibiotics for this issue.  Same thing happened about 8 weeks ago where he had relatively acute onset of severe pain in that region to the point where he could not play golf.  The pain didn't clear up.  Rated the pain at 6 out of 10 maximum.  Recently the pain recurred.  His primary care provider thinks that he has gout which seems likely based on his history.              ROS: All systems reviewed are negative as they relate to the chief complaint within the history of present illness.  Patient denies  fevers or chills.   Assessment & Plan: Visit Diagnoses:  1. Arthritis of left sternoclavicular joint     Plan: Impression is resolving arthritis flare which is likely coming from gout in the left sternoclavicular joint.  The acute onset of pain, severe nature of the pain, and resolution with anti-inflammatories also suggests this is gout in the left sternoclavicular joint.  He's had no other joint swelling issues.  Plan is for observation.  If he develops acute swelling in any of his other joints including the sternoclavicular joint I encouraged him to come back for aspiration and injection.  I'll see him back as needed  Follow-Up Instructions: Return if symptoms worsen or fail to improve.   Orders:  No orders of the defined types were placed in this encounter.  No orders of the defined types were placed in this  encounter.     Procedures: No procedures performed   Clinical Data: No additional findings.  Objective: Vital Signs: There were no vitals taken for this visit.  Physical Exam:   Constitutional: Patient appears well-developed HEENT:  Head: Normocephalic Eyes:EOM are normal Neck: Normal range of motion Cardiovascular: Normal rate Pulmonary/chest: Effort normal Neurologic: Patient is alert Skin: Skin is warm Psychiatric: Patient has normal mood and affect    Ortho Exam: Orthopedic exam demonstrates no asymmetric warmth in the sternoclavicular joint left versus right.  Only slight side-to-side difference in terms of swelling is present in this area.  Left shoulder examination is otherwise normal.  Neck range of motion is normal.  Her sensory function to the hand is intact.  Specialty Comments:  No specialty comments available.  Imaging: No results found.   PMFS History: Patient Active Problem List   Diagnosis Date Noted  . 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 OSA treated with BiPAP 06/07/2017  . 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 Family history of early CAD 12/09/2016  . Epigastric discomfort 12/20/2015  . Chest pain 11/22/2015  . GERD (gastroesophageal reflux disease) 11/22/2015  . ROTATOR CUFF SYNDROME, LEFT 01/07/2010   Past Medical History:  Diagnosis Date  . Family history of prostate cancer    Father  . GERD (gastroesophageal reflux disease)   .  History of chest pain 09/2013   Ruled out for ACS at [facility] ED.  Also dx'd with musculoskeletal CP, costochondritis, and GERD for c/o CP in the past.  ETT normal 06/2015.  Dr. Rennis Golden eval 11/2016--plan for cardiac CT  . Hypertriglyceridemia    Trigs>1400 in 2012: lovaza rx'd  . Morbid obesity (HCC) 12/2014   BMI 49  . OSA on CPAP   . Sternoclavicular joint pain, left 05/2017   w/intermittent swelling: admitted 05/2017 for MRI result suggestive of possible osteomyelitis of left [place] joint: joint fluid analysis did not  support dx of infection.  Some autoimmune/rheum labs pending.  ? Gout---to f/u with ortho.    Family History  Problem Relation Age of Onset  . Prostate cancer Father 64  . Hypertension Father     Past Surgical History:  Procedure Laterality Date  . ESOPHAGOGASTRODUODENOSCOPY     Normal 12/2015 [facility] per pt.  Marland Kitchen ETT  06/2015   Normal  . KNEE SURGERY Right    Arthroscopic  . LASIK  2007  . WRIST SURGERY     Fractured wrist; no hardware in wrist.   Social History   Occupational History  . Not on file.   Social History Main Topics  . Smoking status: Never Smoker  . Smokeless tobacco: Former Neurosurgeon    Types: Snuff  . Alcohol use 1.2 oz/week    2 Shots of liquor per week     Comment: 4-5 weekly  . Drug use: No  . Sexual activity: Not on file

## 2017-06-23 ENCOUNTER — Encounter (HOSPITAL_COMMUNITY): Admission: RE | Admit: 2017-06-23 | Payer: Managed Care, Other (non HMO) | Source: Ambulatory Visit

## 2017-06-23 ENCOUNTER — Encounter (HOSPITAL_COMMUNITY): Payer: Managed Care, Other (non HMO)

## 2017-06-26 ENCOUNTER — Ambulatory Visit: Payer: Managed Care, Other (non HMO) | Admitting: Family Medicine

## 2017-06-29 ENCOUNTER — Encounter (HOSPITAL_COMMUNITY)
Admission: RE | Admit: 2017-06-29 | Discharge: 2017-06-29 | Disposition: A | Discharge status: Home / Self Care | Payer: Managed Care, Other (non HMO) | Source: Ambulatory Visit | Attending: Family Medicine | Admitting: Family Medicine

## 2017-06-29 DIAGNOSIS — M25512 Pain in left shoulder: Secondary | ICD-10-CM | POA: Diagnosis not present

## 2017-06-29 MED ORDER — TECHNETIUM TC 99M MEDRONATE IV KIT
19.9000 | PACK | Freq: Once | INTRAVENOUS | Status: AC
  Administered 2017-06-29: 19.9 via INTRAVENOUS

## 2017-07-01 ENCOUNTER — Encounter: Payer: Self-pay | Admitting: Family Medicine

## 2017-07-06 ENCOUNTER — Encounter: Payer: Self-pay | Admitting: Family Medicine

## 2017-07-08 ENCOUNTER — Telehealth: Payer: Self-pay | Admitting: Family Medicine

## 2017-07-08 ENCOUNTER — Other Ambulatory Visit: Payer: Self-pay | Admitting: *Deleted

## 2017-07-08 ENCOUNTER — Encounter: Payer: Self-pay | Admitting: Family Medicine

## 2017-07-08 ENCOUNTER — Ambulatory Visit (INDEPENDENT_AMBULATORY_CARE_PROVIDER_SITE_OTHER): Payer: Managed Care, Other (non HMO) | Admitting: Family Medicine

## 2017-07-08 VITALS — BP 103/70 | HR 90 | Temp 98.6°F | Resp 16 | Ht 74.0 in | Wt 355.5 lb

## 2017-07-08 DIAGNOSIS — M109 Gout, unspecified: Secondary | ICD-10-CM | POA: Diagnosis not present

## 2017-07-08 DIAGNOSIS — Z0000 Encounter for general adult medical examination without abnormal findings: Secondary | ICD-10-CM

## 2017-07-08 MED ORDER — COLCHICINE 0.6 MG PO TABS: ORAL_TABLET | ORAL | 3 refills | Status: DC

## 2017-07-08 NOTE — Telephone Encounter (Signed)
The labs you listed are good.  No additional labs needed.-thx

## 2017-07-08 NOTE — Telephone Encounter (Signed)
Left message for pt advising Rx has been sent to pharmacy.

## 2017-07-08 NOTE — Telephone Encounter (Signed)
Pt asked after his visit today if he should continue to take the colchicine. If so he will need refills. Please advise. Thanks.

## 2017-07-08 NOTE — Telephone Encounter (Signed)
Patient needing CPE prior to end of year and I couldn't find early morning.  He is going to come into office for labs two days prior to afternoon CPE.  Okay to just place CBC, TSH, Lipid, CMET?  Please advise.

## 2017-07-08 NOTE — Progress Notes (Signed)
OFFICE VISIT  07/08/2017   CC:  Chief Complaint  Patient presents with  . Follow-up    Left [place] arthritis     HPI:    Patient is a 41 y.o. Caucasian male who presents for 3 week f/u L [place] joint arthritis, suspected gout. Ortho agreed with gout dx. I started him on bid colchicine last visit.  Doing better: still some pain in the joint, some rad into trap area and L infraclavicular area. He can do normal daily activities and yard work, ok [employer] now.  Just a mild irritation at this point.  Bone scan showed increased uptake in expected areas in [facility] joint on L and bony areas immediately surrounding this.   Past Medical History:  Diagnosis Date  . Family history of prostate cancer    Father  . GERD (gastroesophageal reflux disease)   . Gouty arthritis    Atypical: L [place] joint  . History of chest pain 09/2013   Ruled out for ACS at [facility] ED.  Also dx'd with musculoskeletal CP, costochondritis, and GERD for c/o CP in the past.  ETT normal 06/2015.  Dr. Rennis Golden eval 11/2016--plan for cardiac CT  . Hypertriglyceridemia    Trigs>1400 in 2012: lovaza rx'd  . Morbid obesity (HCC) 12/2014   BMI 49  . OSA on CPAP   . Sternoclavicular joint pain, left 05/2017   w/intermittent swelling: admitted 05/2017 for MRI result suggestive of possible osteomyelitis of left [place] joint: joint fluid analysis did not support dx of infection.  Some autoimmune/rheum labs pending.  ? Gout (empiric treatment started)---ortho f/u also felt like this is gout.    Past Surgical History:  Procedure Laterality Date  . ESOPHAGOGASTRODUODENOSCOPY     Normal 12/2015 [facility] per pt.  Marland Kitchen ETT  06/2015   Normal  . KNEE SURGERY Right    Arthroscopic  . LASIK  2007  . WRIST SURGERY     Fractured wrist; no hardware in wrist.    Outpatient Medications Prior to Visit  Medication Sig Dispense Refill  . cetirizine (ZYRTEC) 10 MG tablet Take 10 mg by mouth daily as needed (seasonal allergies).    . colchicine 0.6 MG tablet  1 tab po bid 30 tablet 0  . ibuprofen (ADVIL,MOTRIN) 200 MG tablet Take 800 mg by mouth daily as needed for headache (pain).    Marland Kitchen PRESCRIPTION MEDICATION Inhale into the lungs at bedtime. CPAP     No facility-administered medications prior to visit.     Allergies  Allergen Reactions  . Naproxen Nausea And Vomiting and Other (See Comments)    Stomach pain    ROS As per HPI  PE: Blood pressure 103/70, pulse 90, temperature 98.6 F (37 C), temperature source Oral, resp. rate 16, height 6\' 2"  (1.88 m), weight (!) 355 lb 8 oz (161.3 kg), SpO2 95 %. Gen: Alert, well appearing.  Patient is oriented to person, place, time, and situation. AFFECT: pleasant, lucid thought and speech. L [place] joint nontender, no tenderness in manubrium or 1st rib.  No erythema or swelling.  LABS:    Chemistry      Component Value Date/Time   NA 135 06/08/2017 0959   K 4.2 06/08/2017 0959   CL 103 06/08/2017 0959   CO2 25 06/08/2017 0959   BUN 13 06/08/2017 0959   CREATININE 1.12 06/08/2017 0959      Component Value Date/Time   CALCIUM 9.3 06/08/2017 0959   ALKPHOS 72 06/06/2017 1846   AST 34 06/06/2017  1846   ALT 40 06/06/2017 1846   BILITOT 0.7 06/06/2017 1846     Lab Results  Component Value Date   LABURIC 5.4 06/08/2017   IMPRESSION AND PLAN:  Left [place] joint gouty arthritis: responding well to bid colchicine. Discussed possible role for short courses of prednisone in future for "flares" if they occur. No role for xanthine oxidase inhibitor at this time,. The current medical regimen is effective;  continue present plan and medications.  An After Visit Summary was printed and given to the patient.   FOLLOW UP: Return in about 2 months (around 09/07/2017) for annual CPE (fasting).  Signed:  Santiago BumpersPhil McGowen, MD           07/08/2017

## 2017-07-08 NOTE — Telephone Encounter (Signed)
Orders entered

## 2017-07-17 ENCOUNTER — Encounter: Payer: Self-pay | Admitting: Family Medicine

## 2017-07-17 ENCOUNTER — Ambulatory Visit (INDEPENDENT_AMBULATORY_CARE_PROVIDER_SITE_OTHER): Payer: Managed Care, Other (non HMO) | Admitting: Family Medicine

## 2017-07-17 VITALS — BP 133/84 | HR 84 | Temp 98.4°F | Resp 16 | Ht 74.0 in | Wt 354.2 lb

## 2017-07-17 DIAGNOSIS — J209 Acute bronchitis, unspecified: Secondary | ICD-10-CM

## 2017-07-17 MED ORDER — HYDROCODONE-HOMATROPINE 5-1.5 MG/5ML PO SYRP: ORAL_SOLUTION | ORAL | 0 refills | Status: DC

## 2017-07-17 NOTE — Progress Notes (Signed)
OFFICE VISIT  07/17/2017   CC:  Chief Complaint  Patient presents with  . Cough    x 1 week     HPI:    Patient is a 41 y.o. Caucasian male who presents for cough. Onset 1 week ago, some PND feeling in throat, tickle in throat.  No fever or SOB. Dayquil and nyquil and Mucinex DM.  Cough is barky.  Occ comes in 1-2 min "fits".  No chest tightness.  No chest wheeze.  No face pain or congestion, no HA or ST.   Past Medical History:  Diagnosis Date  . Family history of prostate cancer    Father  . GERD (gastroesophageal reflux disease)   . Gouty arthritis    Atypical: L [place] joint  . History of chest pain 09/2013   Ruled out for ACS at [facility] ED.  Also dx'd with musculoskeletal CP, costochondritis, and GERD for c/o CP in the past.  ETT normal 06/2015.  Dr. Rennis Golden eval 11/2016--plan for cardiac CT  . Hypertriglyceridemia    Trigs>1400 in 2012: lovaza rx'd  . Morbid obesity (HCC) 12/2014   BMI 49  . OSA on CPAP   . Sternoclavicular joint pain, left 05/2017   w/intermittent swelling: admitted 05/2017 for MRI result suggestive of possible osteomyelitis of left [place] joint: joint fluid analysis did not support dx of infection.  Some autoimmune/rheum labs pending.  ? Gout (empiric treatment started)---ortho f/u also felt like this is gout.    Past Surgical History:  Procedure Laterality Date  . ESOPHAGOGASTRODUODENOSCOPY     Normal 12/2015 [facility] per pt.  Marland Kitchen ETT  06/2015   Normal  . KNEE SURGERY Right    Arthroscopic  . LASIK  2007  . WRIST SURGERY     Fractured wrist; no hardware in wrist.    Outpatient Medications Prior to Visit  Medication Sig Dispense Refill  . colchicine 0.6 MG tablet 1 tab po bid 180 tablet 3  . ibuprofen (ADVIL,MOTRIN) 200 MG tablet Take 800 mg by mouth daily as needed for headache (pain).    Marland Kitchen PRESCRIPTION MEDICATION Inhale into the lungs at bedtime. CPAP    . cetirizine (ZYRTEC) 10 MG tablet Take 10 mg by mouth daily as needed (seasonal allergies).     No  facility-administered medications prior to visit.     Allergies  Allergen Reactions  . Naproxen Nausea And Vomiting and Other (See Comments)    Stomach pain    ROS As per HPI  PE: Blood pressure 133/84, pulse 84, temperature 98.4 F (36.9 C), temperature source Oral, resp. rate 16, height 6\' 2"  (1.88 m), weight (!) 354 lb 4 oz (160.7 kg), SpO2 94 %. Gen: Alert, well appearing.  Patient is oriented to person, place, time, and situation. AFFECT: pleasant, lucid thought and speech. ENT: Ears: EACs clear, normal epithelium.  TMs with good light reflex and landmarks bilaterally.  Eyes: no injection, icteris, swelling, or exudate.  EOMI, PERRLA. Nose: no drainage or turbinate edema/swelling.  No injection or focal lesion.  Mouth: lips without lesion/swelling.  Oral mucosa pink and moist.  Dentition intact and without obvious caries or gingival swelling.  Oropharynx without erythema, exudate, or swelling.  Neck - No masses or thyromegaly or limitation in range of motion CV: RRR, no m/r/g.   LUNGS: CTA bilat, nonlabored resps, good aeration in all lung fields.  He has occ end exp rhonchi but these clear completely with coughing.   EXT: no clubbing, cyanosis, or edema.  LABS:    Chemistry      Component Value Date/Time   NA 135 06/08/2017 0959   K 4.2 06/08/2017 0959   CL 103 06/08/2017 0959   CO2 25 06/08/2017 0959   BUN 13 06/08/2017 0959   CREATININE 1.12 06/08/2017 0959      Component Value Date/Time   CALCIUM 9.3 06/08/2017 0959   ALKPHOS 72 06/06/2017 1846   AST 34 06/06/2017 1846   ALT 40 06/06/2017 1846   BILITOT 0.7 06/06/2017 1846      IMPRESSION AND PLAN:  Acute bronchitis, suspect viral etiology. No sign of RAD. Symptomatic care: mucinex DM daytime. Hycodan syrup 2 tsp qhs prn. Therapeutic expectations and side effect profile of medication discussed today.  Patient's questions answered. Signs/symptoms to call or return for were reviewed and pt expressed  understanding.  An After Visit Summary was printed and given to the patient.  FOLLOW UP: Return if symptoms worsen or fail to improve.  Signed:  Santiago BumpersPhil Mahlon Gabrielle, MD           07/17/2017

## 2017-09-09 ENCOUNTER — Other Ambulatory Visit (INDEPENDENT_AMBULATORY_CARE_PROVIDER_SITE_OTHER): Payer: Managed Care, Other (non HMO)

## 2017-09-09 DIAGNOSIS — Z0000 Encounter for general adult medical examination without abnormal findings: Secondary | ICD-10-CM

## 2017-09-09 LAB — COMPREHENSIVE METABOLIC PANEL
ALBUMIN: 4.3 g/dL (ref 3.5–5.2)
ALT: 32 U/L (ref 0–53)
AST: 25 U/L (ref 0–37)
Alkaline Phosphatase: 70 U/L (ref 39–117)
BUN: 16 mg/dL (ref 6–23)
CHLORIDE: 105 meq/L (ref 96–112)
CO2: 28 mEq/L (ref 19–32)
Calcium: 9 mg/dL (ref 8.4–10.5)
Creatinine, Ser: 1.02 mg/dL (ref 0.40–1.50)
GFR: 85.44 mL/min (ref >60.00)
Glucose, Bld: 103 mg/dL — ABNORMAL HIGH (ref 70–99)
POTASSIUM: 4.5 meq/L (ref 3.5–5.1)
SODIUM: 138 meq/L (ref 135–145)
Total Bilirubin: 0.5 mg/dL (ref 0.2–1.2)
Total Protein: 7.1 g/dL (ref 6.0–8.3)

## 2017-09-09 LAB — CBC WITH DIFFERENTIAL/PLATELET
BASOS PCT: 0.8 % (ref 0.0–3.0)
Basophils Absolute: 0 10*3/uL (ref 0.0–0.1)
EOS PCT: 3.3 % (ref 0.0–5.0)
Eosinophils Absolute: 0.2 10*3/uL (ref 0.0–0.7)
HEMATOCRIT: 45.1 % (ref 39.0–52.0)
HEMOGLOBIN: 14.9 g/dL (ref 13.0–17.0)
Lymphocytes Relative: 25 % (ref 12.0–46.0)
Lymphs Abs: 1.5 10*3/uL (ref 0.7–4.0)
MCHC: 33.1 g/dL (ref 30.0–36.0)
MCV: 97.2 fl (ref 78.0–100.0)
MONO ABS: 0.6 10*3/uL (ref 0.1–1.0)
Monocytes Relative: 9.6 % (ref 3.0–12.0)
Neutro Abs: 3.8 10*3/uL (ref 1.4–7.7)
Neutrophils Relative %: 61.3 % (ref 43.0–77.0)
Platelets: 283 10*3/uL (ref 150.0–400.0)
RBC: 4.64 Mil/uL (ref 4.22–5.81)
RDW: 13.6 % (ref 11.5–15.5)
WBC: 6.1 10*3/uL (ref 4.0–10.5)

## 2017-09-09 LAB — LIPID PANEL
CHOLESTEROL: 209 mg/dL — AB (ref 0–200)
HDL: 48.5 mg/dL (ref >39.00)
LDL Cholesterol: 125 mg/dL — ABNORMAL HIGH (ref 0–99)
NONHDL: 160.3
Total CHOL/HDL Ratio: 4
Triglycerides: 179 mg/dL — ABNORMAL HIGH (ref 0.0–149.0)
VLDL: 35.8 mg/dL (ref 0.0–40.0)

## 2017-09-09 LAB — TSH: TSH: 3.36 u[IU]/mL (ref 0.35–4.50)

## 2017-09-11 ENCOUNTER — Encounter: Payer: Managed Care, Other (non HMO) | Admitting: Family Medicine

## 2017-09-16 ENCOUNTER — Ambulatory Visit (INDEPENDENT_AMBULATORY_CARE_PROVIDER_SITE_OTHER): Payer: Managed Care, Other (non HMO) | Admitting: Family Medicine

## 2017-09-16 ENCOUNTER — Encounter: Payer: Self-pay | Admitting: Family Medicine

## 2017-09-16 VITALS — BP 144/82 | HR 85 | Temp 97.8°F | Resp 16 | Ht 74.25 in | Wt 363.0 lb

## 2017-09-16 DIAGNOSIS — Z0000 Encounter for general adult medical examination without abnormal findings: Secondary | ICD-10-CM

## 2017-09-16 DIAGNOSIS — B356 Tinea cruris: Secondary | ICD-10-CM | POA: Diagnosis not present

## 2017-09-16 DIAGNOSIS — Z8042 Family history of malignant neoplasm of prostate: Secondary | ICD-10-CM | POA: Diagnosis not present

## 2017-09-16 DIAGNOSIS — Z125 Encounter for screening for malignant neoplasm of prostate: Secondary | ICD-10-CM

## 2017-09-16 DIAGNOSIS — B354 Tinea corporis: Secondary | ICD-10-CM | POA: Diagnosis not present

## 2017-09-16 LAB — PSA: PSA: 0.42 ng/mL (ref 0.10–4.00)

## 2017-09-16 MED ORDER — TERBINAFINE HCL 250 MG PO TABS: ORAL_TABLET | ORAL | 2 refills | Status: DC

## 2017-09-16 NOTE — Patient Instructions (Signed)

## 2017-09-16 NOTE — Progress Notes (Signed)
Office Note 09/16/2017  CC:  Chief Complaint  Patient presents with  . Annual Exam    HPI:  Scott Norris is a 41 y.o. White male who is here for annual health maintenance exam.  No acute complaints.  Past Medical History:  Diagnosis Date  . Family history of prostate cancer    Father  . GERD (gastroesophageal reflux disease)   . Gouty arthritis    Atypical: L [place] joint  . History of chest pain 09/2013   [facility]  Also dx'd with musculoskeletal CP, costochondritis, and GERD for c/o CP in the past.  ETT normal 06/2015.  Dr. Rennis GoldenHilty eval 11/2016--plan for cardiac CT  . Hypertriglyceridemia    Trigs>1400 in 2012: lovaza rx'd  . Morbid obesity (HCC) 12/2014   BMI 49  . OSA on CPAP   . Sternoclavicular joint pain, left 05/2017   w/intermittent swelling: admitted 05/2017 for MRI result suggestive of possible osteomyelitis of left [place] joint: joint fluid analysis did not support dx of infection.  Some autoimmune/rheum labs pending.  ? Gout (empiric treatment started)---ortho f/u also felt like this is gout.    Past Surgical History:  Procedure Laterality Date  . ESOPHAGOGASTRODUODENOSCOPY     Normal 12/2015 [facility] per pt.  Marland Kitchen. ETT  06/2015; 12/2016   2016; Normal.  12/2016 ETT low risk: EF 55-60%, hypertensive response to exercise.  Marland Kitchen. KNEE SURGERY Right    Arthroscopic  . LASIK  2007  . WRIST SURGERY     Fractured wrist; no hardware in wrist.    Family History  Problem Relation Age of Onset  . Prostate cancer Father [id number]  . Hypertension Father     Social History   Socioeconomic History  . Marital status: Single    Spouse name: Not on file  . Number of children: Not on file  . Years of education: Not on file  . Highest education level: Not on file  Social Needs  . Financial resource strain: Not on file  . Food insecurity - worry: Not on file  . Food insecurity - inability: Not on file  . Transportation needs - medical: Not on file  . Transportation  needs - non-medical: Not on file  Occupational History  . Not on file  Tobacco Use  . Smoking status: Never Smoker  . Smokeless tobacco: Former NeurosurgeonUser    Types: Snuff  Substance and Sexual Activity  . Alcohol use: Yes    Alcohol/week: 1.2 oz    Types: 2 Shots of liquor per week    Comment: 4-5 weekly  . Drug use: No  . Sexual activity: Not on file  Other Topics Concern  . Not on file  Social History Narrative   Married, 2 y/o son.   Occupation: Scientist, physiologicalproject manager for commercial construction.   College: [employer]: BS.  Also played football for [employer].   No tobacco.  Drinks 2 bourbon drinks 4-5 days a week.   Dips tobacco.          Outpatient Medications Prior to Visit  Medication Sig Dispense Refill  . colchicine 0.6 MG tablet 1 tab po bid 180 tablet 3  . PRESCRIPTION MEDICATION Inhale into the lungs at bedtime. CPAP    . cetirizine (ZYRTEC) 10 MG tablet Take 10 mg by mouth daily as needed (seasonal allergies).    Marland Kitchen. HYDROcodone-homatropine (HYCODAN) 5-1.5 MG/5ML syrup 2 tsp po qhs prn cough (Patient not taking: Reported on 09/16/2017) 120 mL  0  . ibuprofen (ADVIL,MOTRIN) 200 MG tablet Take 800 mg by mouth daily as needed for headache (pain).     No facility-administered medications prior to visit.     Allergies  Allergen Reactions  . Naproxen Nausea And Vomiting and Other (See Comments)    Stomach pain    ROS Review of Systems  Constitutional: Negative for appetite change, chills, fatigue and fever.  HENT: Negative for congestion, dental problem, ear pain and sore throat.   Eyes: Negative for discharge, redness and visual disturbance.  Respiratory: Negative for cough, chest tightness, shortness of breath and wheezing.   Cardiovascular: Negative for chest pain, palpitations and leg swelling.  Gastrointestinal: Negative for abdominal pain, blood in stool, diarrhea, nausea and vomiting.  Genitourinary: Negative for difficulty urinating, dysuria, flank pain, frequency,  hematuria and urgency.  Musculoskeletal: Negative for arthralgias, back pain, joint swelling, myalgias and neck stiffness.  Skin: Positive for rash (itchy hyperpigmented rash on low back, buttocks, groin creases--recurrent x yrs). Negative for pallor.  Neurological: Negative for dizziness, speech difficulty, weakness and headaches.  Hematological: Negative for adenopathy. Does not bruise/bleed easily.  Psychiatric/Behavioral: Negative for confusion and sleep disturbance. The patient is not nervous/anxious.     PE; Blood pressure (!) 144/82, pulse 85, temperature 97.8 F (36.6 C), temperature source Oral, resp. rate 16, height 6' 2.25" (1.886 m), weight (!) 363 lb (164.7 kg), SpO2 96 %. Gen: Alert, well appearing.  Patient is oriented to person, place, time, and situation. AFFECT: pleasant, lucid thought and speech. ENT: Ears: EACs clear, normal epithelium.  TMs with good light reflex and landmarks bilaterally.  Eyes: no injection, icteris, swelling, or exudate.  EOMI, PERRLA. Nose: no drainage or turbinate edema/swelling.  No injection or focal lesion.  Mouth: lips without lesion/swelling.  Oral mucosa pink and moist.  Dentition intact and without obvious caries or gingival swelling.  Oropharynx without erythema, exudate, or swelling.  Neck: supple/nontender.  No LAD, mass, or TM.  Carotid pulses 2+ bilaterally, without bruits. CV: RRR, no m/r/g.   LUNGS: CTA bilat, nonlabored resps, good aeration in all lung fields. ABD: soft, NT, ND, BS normal.  No hepatospenomegaly or mass.  No bruits. EXT: no clubbing, cyanosis, or edema.  Musculoskeletal: no joint swelling, erythema, warmth, or tenderness.  ROM of all joints intact. Skin - no sores or suspicious lesions.  Well demarcated hyperpigmented rash with superficial peeling on low back, both glut surfaces, groin creases.  Well demarcated borders.  No erythema or maceration. Rectal exam: negative without mass, lesions or tenderness, PROSTATE EXAM:  smooth and symmetric without nodules or tenderness.   Pertinent labs:  Lab Results  Component Value Date   TSH 3.36 09/09/2017   Lab Results  Component Value Date   WBC 6.1 09/09/2017   HGB 14.9 09/09/2017   HCT 45.1 09/09/2017   MCV 97.2 09/09/2017   PLT 283.0 09/09/2017   Lab Results  Component Value Date   CREATININE 1.02 09/09/2017   BUN 16 09/09/2017   NA 138 09/09/2017   K 4.5 09/09/2017   CL 105 09/09/2017   CO2 28 09/09/2017   Lab Results  Component Value Date   ALT 32 09/09/2017   AST 25 09/09/2017   ALKPHOS 70 09/09/2017   BILITOT 0.5 09/09/2017   Lab Results  Component Value Date   CHOL 209 (H) 09/09/2017   Lab Results  Component Value Date   HDL 48.50 09/09/2017   Lab Results  Component Value Date   LDLCALC 125 (H)  09/09/2017   Lab Results  Component Value Date   TRIG 179.0 (H) 09/09/2017   Lab Results  Component Value Date   CHOLHDL 4 09/09/2017   Lab Results  Component Value Date   PSA 0.55 09/05/2016   PSA 0.46 01/12/2015    ASSESSMENT AND PLAN:   Health maintenance exam: Reviewed age and gender appropriate health maintenance issues (prudent diet, regular exercise, health risks of tobacco and excessive alcohol, use of seatbelts, fire alarms in home, use of sunscreen).  Also reviewed age and gender appropriate health screening as well as vaccine recommendations. Vaccines: Tdap and flu UTD. Labs: fasting HP labs reviewed in detail with pt today. Prostate ca screening--pt with FH of prostate ca (father): will get PSA today.   DRE normal. Colon ca screening: pt avg risk so we'll start this screening at age 30 yrs.  Recurent tinea corporis/cruris.  Terbinafine 250mg  qd x 14d, may RF prn (x 2) for recurrences. Therapeutic expectations and side effect profile of medication discussed today.  Patient's questions answered.  An After Visit Summary was printed and given to the patient.  FOLLOW UP:  Return in about 1 year (around 09/16/2018)  for annual CPE (fasting).  Signed:  Santiago Bumpers, MD           09/16/2017

## 2018-04-08 ENCOUNTER — Encounter: Payer: Self-pay | Admitting: Family Medicine

## 2018-04-08 ENCOUNTER — Ambulatory Visit (INDEPENDENT_AMBULATORY_CARE_PROVIDER_SITE_OTHER): Payer: Managed Care, Other (non HMO) | Admitting: Family Medicine

## 2018-04-08 VITALS — BP 121/83 | HR 76 | Resp 16 | Ht 74.25 in | Wt 355.0 lb

## 2018-04-08 DIAGNOSIS — G5603 Carpal tunnel syndrome, bilateral upper limbs: Secondary | ICD-10-CM

## 2018-04-08 NOTE — Progress Notes (Signed)
OFFICE VISIT  04/08/2018   CC:  Chief Complaint  Patient presents with  . hand numbness    bilateral hand and finger numbness   HPI:    Patient is a 42 y.o. Caucasian male who presents for numbness in hands and fingers. Onset several months ago: intermittent feeling of numbness in both hands and fingers, L hand >R. Mostly first 3 fingers on L, R hand he's not as sure. He is right handed.   Doing anything that grips something makes it worse.  He can stop what he's doing and "shake it out" and it gets better. Wakes up some in middle of night with left hand numbness. Some pain/throbbing in hand when numbness is the worst.  No wrist pain. No color change in wrists or hands. 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 No recent injury/trauma.  Past Medical History:  Diagnosis Date  . Family history of prostate cancer    Father  . GERD (gastroesophageal reflux disease)   . Gouty arthritis    Atypical: L [place] joint  . History of chest pain 09/2013   [facility]  Also dx'd with musculoskeletal CP, costochondritis, and GERD for c/o CP in the past.  ETT normal 06/2015.  Dr. Rennis GoldenHilty eval 11/2016--plan for cardiac CT  . Hypertriglyceridemia    Trigs>1400 in 2012: lovaza rx'd  . Morbid obesity (HCC) 12/2014   BMI 49  . OSA on CPAP   . Sternoclavicular joint pain, left 05/2017   w/intermittent swelling: admitted 05/2017 for MRI result suggestive of possible osteomyelitis of left [place] joint: joint fluid analysis did not support dx of infection.  Some autoimmune/rheum labs pending.  ? Gout (empiric treatment started)---ortho f/u also felt like this is gout.    Past Surgical History:  Procedure Laterality Date  . ESOPHAGOGASTRODUODENOSCOPY     Normal 12/2015 [facility] per pt.  Marland Kitchen. ETT  06/2015; 12/2016   2016; Normal.  12/2016 ETT low risk: EF 55-60%,  hypertensive response to exercise.  Marland Kitchen. KNEE SURGERY Right    Arthroscopic  . LASIK  2007  . WRIST SURGERY     Fractured wrist; no hardware in wrist.    Outpatient Medications Prior to Visit  Medication Sig Dispense Refill  . PRESCRIPTION MEDICATION Inhale into the lungs at bedtime. CPAP    . cetirizine (ZYRTEC) 10 MG tablet Take 10 mg by mouth daily as needed (seasonal allergies).    . colchicine 0.6 MG tablet 1 tab po bid (Patient not taking: Reported on 04/08/2018) 180 tablet 3  . HYDROcodone-homatropine (HYCODAN) 5-1.5 MG/5ML syrup 2 tsp po qhs prn cough (Patient not taking: Reported on 04/08/2018) 120 mL 0  . ibuprofen (ADVIL,MOTRIN) 200 MG tablet Take 800 mg by mouth daily as needed for headache (pain).    Marland Kitchen. terbinafine (LAMISIL) 250 MG tablet 1 tab po qd x 14d for rash (Patient not taking: Reported on 04/08/2018) 14 tablet 2   No facility-administered medications prior to visit.     Allergies  Allergen Reactions  . Naproxen Nausea And Vomiting and Other (See Comments)    Stomach pain    ROS As per HPI  PE: Blood pressure 121/83, pulse 76, resp. rate 16, height 6' 2.25" (1.886 m), weight (!) 355 lb (161 kg), SpO2 95 %. Gen: Alert, well appearing.  Patient is oriented to  person, place, time, and situation. AFFECT: pleasant, lucid thought and speech. Wrists and hands/fingers pink and warm.  No swelling. Radial and ulnar pulses 2+ bilat.  Thenar eminence normal, no hand/finger mm atrophy. Strength in hands/fingers 5/5 bilat.   Phalen's: ++ on left, + on right. Tinel's: ++ on left, + on right.  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 Discussed dx and some ergonomic changes he could make at work to minimize this. We did not have wrist splints to fit him so I wrote rx for him to get these at medical supply store, instructed him on use of these, + icing regimen, + relative rest of wrists/hands/fingers.  An After Visit Summary was printed and  given to the patient.  FOLLOW UP: Return if symptoms worsen or fail to improve.  Signed:  Santiago Bumpers, MD           04/08/2018

## 2018-07-02 ENCOUNTER — Ambulatory Visit: Payer: Self-pay

## 2018-07-02 ENCOUNTER — Encounter: Payer: Self-pay | Admitting: Family Medicine

## 2018-07-02 ENCOUNTER — Ambulatory Visit (INDEPENDENT_AMBULATORY_CARE_PROVIDER_SITE_OTHER): Payer: Managed Care, Other (non HMO) | Admitting: Family Medicine

## 2018-07-02 VITALS — BP 122/77 | HR 78 | Temp 98.4°F | Resp 16 | Ht 74.25 in | Wt 355.0 lb

## 2018-07-02 DIAGNOSIS — S61219A Laceration without foreign body of unspecified finger without damage to nail, initial encounter: Secondary | ICD-10-CM | POA: Diagnosis not present

## 2018-07-02 NOTE — Telephone Encounter (Signed)
Saw pt in office today

## 2018-07-02 NOTE — Progress Notes (Signed)
OFFICE VISIT  07/02/2018   CC:  Chief Complaint  Patient presents with  . Laceration    finger   HPI:    Patient is a 42 y.o. Caucasian male who presents for a cut on his finger. Right hand 4th finger volar surface at MCP intersection/crease--cut it when he grabbed a broken toilet to pick it up--happened 6 d/a.  It has healed up some, but still a little open, and he says his wife really wanted him to come in and get it looked at in case something else needed to be done to it. Pt says is barely bled when it happened.  He cleaned it.  He has been wearing a bandage over it. Last Td was 2017.    Past Medical History:  Diagnosis Date  . Family history of prostate cancer    Father  . GERD (gastroesophageal reflux disease)   . Gouty arthritis    Atypical: L [place] joint  . History of chest pain 09/2013   Ruled out for ACS at [facility] ED.  Also dx'd with musculoskeletal CP, costochondritis, and GERD for c/o CP in the past.  ETT normal 06/2015.  Dr. Rennis Golden eval 11/2016--plan for cardiac CT  . Hypertriglyceridemia    Trigs>1400 in 2012: lovaza rx'd  . Morbid obesity (HCC) 12/2014   BMI 49  . OSA on CPAP   . Sternoclavicular joint pain, left 05/2017   w/intermittent swelling: admitted 05/2017 for MRI result suggestive of possible osteomyelitis of left [place] joint: joint fluid analysis did not support dx of infection.  Some autoimmune/rheum labs pending.  ? Gout (empiric treatment started)---ortho f/u also felt like this is gout.    Past Surgical History:  Procedure Laterality Date  . ESOPHAGOGASTRODUODENOSCOPY     Normal 12/2015 [facility] per pt.  Marland Kitchen ETT  06/2015; 12/2016   2016; Normal.  12/2016 ETT low risk: EF 55-60%, hypertensive response to exercise.  Marland Kitchen KNEE SURGERY Right    Arthroscopic  . LASIK  2007  . WRIST SURGERY     Fractured wrist; no hardware in wrist.    Outpatient Medications Prior to Visit  Medication Sig Dispense Refill  . PRESCRIPTION MEDICATION Inhale into the lungs at bedtime.  CPAP    . cetirizine (ZYRTEC) 10 MG tablet Take 10 mg by mouth daily as needed (seasonal allergies).    . colchicine 0.6 MG tablet 1 tab po bid (Patient not taking: Reported on 04/08/2018) 180 tablet 3  . HYDROcodone-homatropine (HYCODAN) 5-1.5 MG/5ML syrup 2 tsp po qhs prn cough (Patient not taking: Reported on 04/08/2018) 120 mL 0  . ibuprofen (ADVIL,MOTRIN) 200 MG tablet Take 800 mg by mouth daily as needed for headache (pain).    Marland Kitchen terbinafine (LAMISIL) 250 MG tablet 1 tab po qd x 14d for rash (Patient not taking: Reported on 04/08/2018) 14 tablet 2   No facility-administered medications prior to visit.     Allergies  Allergen Reactions  . Naproxen Nausea And Vomiting and Other (See Comments)    Stomach pain    ROS As per HPI  PE: Blood pressure 122/77, pulse 78, temperature 98.4 F (36.9 C), temperature source Oral, resp. rate 16, height 6' 2.25" (1.886 m), weight (!) 355 lb (161 kg), SpO2 95 %. Gen: Alert, well appearing.  Patient is oriented to person, place, time, and situation. AFFECT: pleasant, lucid thought and speech. R hand 4th finger volar surface at intersection of MCP joint/crease---2cm linear laceration with wound edges well approximated on 1/2, but the other  1/2 has 1 mm of separation.  No erythema, drainage, swelling, odor, or tenderness or warmth.  ROM of finger intact.  LABS:    Chemistry      Component Value Date/Time   NA 138 09/09/2017 0807   K 4.5 09/09/2017 0807   CL 105 09/09/2017 0807   CO2 28 09/09/2017 0807   BUN 16 09/09/2017 0807   CREATININE 1.02 09/09/2017 0807      Component Value Date/Time   CALCIUM 9.0 09/09/2017 0807   ALKPHOS 70 09/09/2017 0807   AST 25 09/09/2017 0807   ALT 32 09/09/2017 0807   BILITOT 0.5 09/09/2017 0807      IMPRESSION AND PLAN:  Finger lac---healing fine but it is located in a crease that gets moved around/stretched a lot so the approximation of the wound edges has not completely occurred. Superglue applied to  the unapproximated area today w/out problem. Covered the wound with dressing. Home care instructions given.  An After Visit Summary was printed and given to the patient.  FOLLOW UP: Return if symptoms worsen or fail to improve.  Signed:  Santiago Bumpers, MD           07/02/2018

## 2018-07-02 NOTE — Telephone Encounter (Signed)
Pt.'s wife report pt. Cut his right fourth finger last Saturday while removing a commode. Did not seek treatment. States area has not "closed up or healed." States he is using his finger without difficulty. Request to have the laceration looked at. Appointment made.  Reason for Disposition . [1] Last tetanus shot > 5 years ago AND [2] DIRTY cut or scrape  Answer Assessment - Initial Assessment Questions 1. MECHANISM: "How did the injury happen?"      Last Saturday 2. ONSET: "When did the injury happen?" (Minutes or hours ago)      Last Saturday 3. LOCATION: "What part of the finger is injured?" "Is the nail damaged?"       Fourth finger on his right hand 4. APPEARANCE of the INJURY: "What does the injury look like?"      Cut 5. SEVERITY: "Can you use the hand normally?"  "Can you bend your fingers into a ball and then fully open them?"     Using hand ok 6. SIZE: For cuts, bruises, or swelling, ask: "How large is it?" (e.g., inches or centimeters;  entire finger)      Width of finger 7. PAIN: "Is there pain?" If so, ask: "How bad is the pain?"    (e.g., Scale 1-10; or mild, moderate, severe)     Unsure 8. TETANUS: For any breaks in the skin, ask: "When was the last tetanus booster?"     Unsure 9. OTHER SYMPTOMS: "Do you have any other symptoms?"     No 10. PREGNANCY: "Is there any chance you are pregnant?" "When was your last menstrual period?"       n/a  Protocols used: FINGER INJURY-A-AH

## 2018-08-16 ENCOUNTER — Encounter: Payer: Self-pay | Admitting: Cardiology

## 2018-08-17 ENCOUNTER — Telehealth: Payer: Self-pay

## 2018-08-17 NOTE — Telephone Encounter (Signed)
[facility]. Notes are filed.

## 2018-09-13 ENCOUNTER — Encounter: Payer: Self-pay | Admitting: Cardiology

## 2018-09-13 ENCOUNTER — Ambulatory Visit (INDEPENDENT_AMBULATORY_CARE_PROVIDER_SITE_OTHER): Payer: Managed Care, Other (non HMO) | Admitting: Cardiology

## 2018-09-13 DIAGNOSIS — I209 Angina pectoris, unspecified: Secondary | ICD-10-CM | POA: Diagnosis not present

## 2018-09-13 MED ORDER — NITROGLYCERIN 0.4 MG SL SUBL: 0.4000 mg | SUBLINGUAL_TABLET | SUBLINGUAL | 6 refills | Status: DC | PRN

## 2018-09-13 MED ORDER — METOPROLOL TARTRATE 50 MG PO TABS: 50.0000 mg | ORAL_TABLET | Freq: Once | ORAL | 0 refills | Status: DC

## 2018-09-13 NOTE — Patient Instructions (Signed)
Medication Instructions:  Your physician has recommended you make the following change in your medication:   START nitroglycerin as needed: When having chest pain, stop what you are doing and sit down. Take 1 nitro, wait 5 minutes. Still having chest pain, take 1 nitro, wait 5 minutes. Still having chest pain, take 1 nitro, dial 911. Total of 3 nitro in 15 minutes.  If you need a refill on your cardiac medications before your next appointment, please call your pharmacy.   Lab work: Your physician recommends that you return for lab work within 3-7 days before cardiac CTA: BMP. Please return to our office for lab work, no appointment needed. No need to fast beforehand.   If you have labs (blood work) drawn today and your tests are completely normal, you will receive your results only by: Scott Norris. MyChart Message (if you have MyChart) OR . A paper copy in the mail If you have any lab test that is abnormal or we need to change your treatment, we will call you to review the results.  Testing/Procedures: Your physician has requested that you have cardiac CT. Cardiac computed tomography (CT) is a painless test that uses an x-ray machine to take clear, detailed pictures of your heart. For further information please visit [email or web address]. Please follow instruction sheet as given.   [facility] of [facility] at xx:xx AM (30-45 minutes prior to test start time)  [facility] [address] [place], [place] [address] [phone]  Proceed to the [facility] Radiology Department (First Floor).  Please follow these instructions carefully (unless otherwise directed):  Hold all erectile dysfunction medications at least 48 hours prior to test.  On the Night Before the Test: . Be sure to Drink plenty of water. . Do not consume any caffeinated/decaffeinated beverages or chocolate 12 hours prior to your test. . Do not take any antihistamines 12  hours prior to your test.  On the Day of the Test: . Drink plenty of water. Do not drink any water within one hour of the test. . Do not eat any food 4 hours prior to the test. . You may take your regular medications prior to the test.  . Take metoprolol (Lopressor) two hours prior to test. . HOLD Furosemide/Hydrochlorothiazide morning of the test.   *For Clinical Staff only. Please instruct patient the following:*        -Drink plenty of water       -Take metoprolol (Lopressor) 2 hours prior to test (if applicable).                  -If HR is less than 55 BPM- No Beta Blocker                -IF HR is greater than 55 BPM and patient is less than or equal to 42 yrs old Lopressor 100mg  x1.                -If HR is greater than 55 BPM and patient is greater than 42 yrs old Lopressor 50 mg x1.         After the Test: . Drink plenty of water. . After receiving IV contrast, you may experience a mild flushed feeling. This is normal. . On occasion, you may experience a mild rash up to 24 hours after the test. This is not dangerous. If this occurs, you can take Benadryl 25 mg and increase your fluid intake. .Scott Norris  If you experience trouble breathing, this can be serious. If it is severe call 911 IMMEDIATELY. If it is mild, please call our office.   Follow-Up: At [facility], you and your health needs are our priority.  As part of our continuing mission to provide you with exceptional heart care, we have created designated Provider Care Teams.  These Care Teams include your primary Cardiologist (physician) and Advanced Practice Providers (APPs -  Physician Assistants and Nurse Practitioners) who all work together to provide you with the care you need, when you need it. You will need a follow up appointment in 6 months.  Please call our office 2 months in advance to schedule this appointment.      Nitroglycerin sublingual tablets What is this medicine? NITROGLYCERIN (nye troe GLI ser in) is a  type of vasodilator. It relaxes blood vessels, increasing the blood and oxygen supply to your heart. This medicine is used to relieve chest pain caused by angina. It is also used to prevent chest pain before activities like climbing stairs, going outdoors in cold weather, or sexual activity. This medicine may be used for other purposes; ask your health care provider or pharmacist if you have questions. COMMON BRAND NAME(S): Nitroquick, Nitrostat, Nitrotab What should I tell my health care provider before I take this medicine? They need to know if you have any of these conditions: -anemia -head injury, recent stroke, or bleeding in the brain -liver disease -previous heart attack -an unusual or allergic reaction to nitroglycerin, other medicines, foods, dyes, or preservatives -pregnant or trying to get pregnant -breast-feeding How should I use this medicine? Take this medicine by mouth as needed. At the first sign of an angina attack (chest pain or tightness) place one tablet under your tongue. You can also take this medicine 5 to 10 minutes before an event likely to produce chest pain. Follow the directions on the prescription label. Let the tablet dissolve under the tongue. Do not swallow whole. Replace the dose if you accidentally swallow it. It will help if your mouth is not dry. Saliva around the tablet will help it to dissolve more quickly. Do not eat or drink, smoke or chew tobacco while a tablet is dissolving. If you are not better within 5 minutes after taking ONE dose of nitroglycerin, call 9-1-1 immediately to seek emergency medical care. Do not take more than 3 nitroglycerin tablets over 15 minutes. If you take this medicine often to relieve symptoms of angina, your doctor or health care professional may provide you with different instructions to manage your symptoms. If symptoms do not go away after following these instructions, it is important to call 9-1-1 immediately. Do not take more  than 3 nitroglycerin tablets over 15 minutes. Talk to your pediatrician regarding the use of this medicine in children. Special care may be needed. Overdosage: If you think you have taken too much of this medicine contact a poison control center or emergency room at once. NOTE: This medicine is only for you. Do not share this medicine with others. What if I miss a dose? This does not apply. This medicine is only used as needed. What may interact with this medicine? Do not take this medicine with any of the following medications: -certain migraine medicines like ergotamine and dihydroergotamine (DHE) -medicines used to treat erectile dysfunction like sildenafil, tadalafil, and vardenafil -riociguat This medicine may also interact with the following medications: -alteplase -aspirin -heparin -medicines for high blood pressure -medicines for mental depression -other medicines  used to treat angina -phenothiazines like chlorpromazine, mesoridazine, prochlorperazine, thioridazine This list may not describe all possible interactions. Give your health care provider a list of all the medicines, herbs, non-prescription drugs, or dietary supplements you use. Also tell them if you smoke, drink alcohol, or use illegal drugs. Some items may interact with your medicine. What should I watch for while using this medicine? Tell your doctor or health care professional if you feel your medicine is no longer working. Keep this medicine with you at all times. Sit or lie down when you take your medicine to prevent falling if you feel dizzy or faint after using it. Try to remain calm. This will help you to feel better faster. If you feel dizzy, take several deep breaths and lie down with your feet propped up, or bend forward with your head resting between your knees. You may get drowsy or dizzy. Do not drive, use machinery, or do anything that needs mental alertness until you know how this drug affects you. Do not  stand or sit up quickly, especially if you are an older patient. This reduces the risk of dizzy or fainting spells. Alcohol can make you more drowsy and dizzy. Avoid alcoholic drinks. Do not treat yourself for coughs, colds, or pain while you are taking this medicine without asking your doctor or health care professional for advice. Some ingredients may increase your blood pressure. What side effects may I notice from receiving this medicine? Side effects that you should report to your doctor or health care professional as soon as possible: -blurred vision -dry mouth -skin rash -sweating -the feeling of extreme pressure in the head -unusually weak or tired Side effects that usually do not require medical attention (report to your doctor or health care professional if they continue or are bothersome): -flushing of the face or neck -headache -irregular heartbeat, palpitations -nausea, vomiting This list may not describe all possible side effects. Call your doctor for medical advice about side effects. You may report side effects to FDA at 1-800-FDA-1088. Where should I keep my medicine? Keep out of the reach of children. Store at room temperature between 20 and 25 degrees C (68 and 77 degrees F). Store in Retail buyer. Protect from light and moisture. Keep tightly closed. Throw away any unused medicine after the expiration date. NOTE: This sheet is a summary. It may not cover all possible information. If you have questions about this medicine, talk to your doctor, pharmacist, or health care provider.  2019 Elsevier/Gold Standard (2013-07-07 17:57:36)      Cardiac CT Angiogram  A cardiac CT angiogram is a procedure to look at the heart and the area around the heart. It may be done to help find the cause of chest pains or other symptoms of heart disease. During this procedure, a large X-ray machine, called a CT scanner, takes detailed pictures of the heart and the surrounding area  after a dye (contrast material) has been injected into blood vessels in the area. The procedure is also sometimes called a coronary CT angiogram, coronary artery scanning, or CTA. A cardiac CT angiogram allows the health care provider to see how well blood is flowing to and from the heart. The health care provider will be able to see if there are any problems, such as:  Blockage or narrowing of the coronary arteries in the heart.  Fluid around the heart.  Signs of weakness or disease in the muscles, valves, and tissues of the heart. Tell a health  care provider about:  Any allergies you have. This is especially important if you have had a previous allergic reaction to contrast dye.  All medicines you are taking, including vitamins, herbs, eye drops, creams, and over-the-counter medicines.  Any blood disorders you have.  Any surgeries you have had.  Any medical conditions you have.  Whether you are pregnant or may be pregnant.  Any anxiety disorders, chronic pain, or other conditions you have that may increase your stress or prevent you from lying still. What are the risks? Generally, this is a safe procedure. However, problems may occur, including:  Bleeding.  Infection.  Allergic reactions to medicines or dyes.  Damage to other structures or organs.  Kidney damage from the dye or contrast that is used.  Increased risk of cancer from radiation exposure. This risk is low. Talk with your health care provider about: ? The risks and benefits of testing. ? How you can receive the lowest dose of radiation. What happens before the procedure?  Wear comfortable clothing and remove any jewelry, glasses, dentures, and hearing aids.  Follow instructions from your health care provider about eating and drinking. This may include: ? For 12 hours before the test - avoid caffeine. This includes tea, coffee, soda, energy drinks, and diet pills. Drink plenty of water or other fluids that do  not have caffeine in them. Being well-hydrated can prevent complications. ? For 4-6 hours before the test - stop eating and drinking. The contrast dye can cause nausea, but this is less likely if your stomach is empty.  Ask your health care provider about changing or stopping your regular medicines. This is especially important if you are taking diabetes medicines, blood thinners, or medicines to treat erectile dysfunction. What happens during the procedure?  Hair on your chest may need to be removed so that small sticky patches called electrodes can be placed on your chest. These will transmit information that helps to monitor your heart during the test.  An IV tube will be inserted into one of your veins.  You might be given a medicine to control your heart rate during the test. This will help to ensure that good images are obtained.  You will be asked to lie on an exam table. This table will slide in and out of the CT machine during the procedure.  Contrast dye will be injected into the IV tube. You might feel warm, or you may get a metallic taste in your mouth.  You will be given a medicine (nitroglycerin) to relax (dilate) the arteries in your heart.  The table that you are lying on will move into the CT machine tunnel for the scan.  The person running the machine will give you instructions while the scans are being done. You may be asked to: ? Keep your arms above your head. ? Hold your breath. ? Stay very still, even if the table is moving.  When the scanning is complete, you will be moved out of the machine.  The IV tube will be removed. The procedure may vary among health care providers and hospitals. What happens after the procedure?  You might feel warm, or you may get a metallic taste in your mouth from the contrast dye.  You may have a headache from the nitroglycerin.  After the procedure, drink water or other fluids to wash (flush) the contrast material out of your  body.  Contact a health care provider if you have any symptoms of allergy to  the contrast. These symptoms include: ? Shortness of breath. ? Rash or hives. ? A racing heartbeat.  Most people can return to their normal activities right after the procedure. Ask your health care provider what activities are safe for you.  It is up to you to get the results of your procedure. Ask your health care provider, or the department that is doing the procedure, when your results will be ready. Summary  A cardiac CT angiogram is a procedure to look at the heart and the area around the heart. It may be done to help find the cause of chest pains or other symptoms of heart disease.  During this procedure, a large X-ray machine, called a CT scanner, takes detailed pictures of the heart and the surrounding area after a dye (contrast material) has been injected into blood vessels in the area.  Ask your health care provider about changing or stopping your regular medicines before the procedure. This is especially important if you are taking diabetes medicines, blood thinners, or medicines to treat erectile dysfunction.  After the procedure, drink water or other fluids to wash (flush) the contrast material out of your body. This information is not intended to replace advice given to you by your health care provider. Make sure you discuss any questions you have with your health care provider. Document Released: 08/21/2008 Document Revised: 07/28/2016 Document Reviewed: 07/28/2016 Elsevier Interactive Patient Education  2019 [employer].

## 2018-09-13 NOTE — Progress Notes (Signed)
Cardiology Office Note:    Date:  09/13/2018   ID:  Scott Norris Eder, DOB 12-Jul-1976, MRN [id number]  PCP:  Jeoffrey MassedMcGowen, Philip H, MD  Cardiologist:  Garwin Brothersajan R , MD   Referring MD: Koren ShiverMasneri, Shannon M, DO    ASSESSMENT:    1. Morbid obesity (HCC)   2. Angina pectoris (HCC)    PLAN:    In order of problems listed above:  1. Primary prevention stressed with the patient.  Importance of compliance with diet and medication stressed and he vocalized understanding.  His blood pressure is stable.  Diet was discussed for dyslipidemia and obesity and risks of obesity explained and he vocalized understanding.  His blood pressure is elevated but he has an element of whitecoat hypertension. 2. In view of his symptoms I discussed invasive and noninvasive coronary angiography.  He prefers coronary CT angiography and we will arrange for the same.  He knows to the nearest emergency room for any concerning symptoms. 3. Patient will be seen in follow-up appointment in 6 months or earlier if the patient has any concerns    Medication Adjustments/Labs and Tests Ordered: Current medicines are reviewed at length with the patient today.  Concerns regarding medicines are outlined above.  No orders of the defined types were placed in this encounter.  No orders of the defined types were placed in this encounter.    History of Present Illness:    Scott Norris Scott Norris is a 42 y.o. male who is being seen today for the evaluation of chest discomfort at the request of Koren ShiverMasneri, Shannon M, DO.  Patient is a pleasant 42 year old male.  He has no significant past medical history.  He has mild dyslipidemia.  He is morbidly obese and leads a sedentary lifestyle.  He has chest tightness at times.  He does not tell me about exertion.  No orthopnea or PND.  It lasts for about 10 or 15 minutes and does not radiate to any part of the body.  For the symptoms he had a stress test last year and this was unremarkable.  The  symptoms are concerning him and he wants a further definitive evaluation.  Again with exertion the history is unclear but with sexual activity or playing with his 831-year-old son the symptoms do not get worse.  At the time of my evaluation, the patient is alert awake oriented and in no distress.  Past Medical History:  Diagnosis Date  . Family history of prostate cancer    Father  . GERD (gastroesophageal reflux disease)   . Gouty arthritis    Atypical: L [place] joint  . History of chest pain 09/2013   [facility]  Also dx'd with musculoskeletal CP, costochondritis, and GERD for c/o CP in the past.  ETT normal 06/2015.  Dr. Rennis GoldenHilty eval 11/2016--plan for cardiac CT  . Hypertriglyceridemia    Trigs>1400 in 2012: lovaza rx'd  . Morbid obesity (HCC) 12/2014   BMI 49  . OSA on CPAP   . Sternoclavicular joint pain, left 05/2017   w/intermittent swelling: admitted 05/2017 for MRI result suggestive of possible osteomyelitis of left [place] joint: joint fluid analysis did not support dx of infection.  Some autoimmune/rheum labs pending.  ? Gout (empiric treatment started)---ortho f/u also felt like this is gout.    Past Surgical History:  Procedure Laterality Date  . ESOPHAGOGASTRODUODENOSCOPY     Normal 12/2015 [facility] per pt.  Marland Kitchen. ETT  06/2015; 12/2016   2016;  Normal.  12/2016 ETT low risk: EF 55-60%, hypertensive response to exercise.  Marland Kitchen. KNEE SURGERY Right    Arthroscopic  . LASIK  2007  . WRIST SURGERY     Fractured wrist; no hardware in wrist.    Current Medications: Current Meds  Medication Sig  . PRESCRIPTION MEDICATION Inhale into the lungs at bedtime. CPAP     Allergies:   Naproxen   Social History   Socioeconomic History  . Marital status: Single    Spouse name: Not on file  . Number of children: Not on file  . Years of education: Not on file  . Highest education level: Not on file  Occupational History  . Not on file  Social Needs  . Financial resource strain: Not on  file  . Food insecurity:    Worry: Not on file    Inability: Not on file  . Transportation needs:    Medical: Not on file    Non-medical: Not on file  Tobacco Use  . Smoking status: Never Smoker  . Smokeless tobacco: Former NeurosurgeonUser    Types: Snuff  Substance and Sexual Activity  . Alcohol use: Yes    Alcohol/week: 2.0 standard drinks    Types: 2 Shots of liquor per week    Comment: 4-5 weekly  . Drug use: No  . Sexual activity: Not on file  Lifestyle  . Physical activity:    Days per week: Not on file    Minutes per session: Not on file  . Stress: Not on file  Relationships  . Social connections:    Talks on phone: Not on file    Gets together: Not on file    Attends religious service: Not on file    Active member of club or organization: Not on file    Attends meetings of clubs or organizations: Not on file    Relationship status: Not on file  Other Topics Concern  . Not on file  Social History Narrative   Married, 2 y/o son.   Occupation: Scientist, physiologicalproject manager for commercial construction.   College: [employer]: BS.  Also played football for [employer].   No tobacco.  Drinks 2 bourbon drinks 4-5 days a week.   Dips tobacco.           Family History: The patient's family history includes Hypertension in his father; Prostate cancer (age of onset: 3463) in his father.  ROS:   Please see the history of present illness.    All other systems reviewed and are negative.  EKGs/Labs/Other Studies Reviewed:    The following studies were reviewed today: I discussed my findings with the patient at length.   Recent Labs: No results found for requested labs within last 8760 hours.  Recent Lipid Panel    Component Value Date/Time   CHOL 209 (H) 09/09/2017 0807   TRIG 179.0 (H) 09/09/2017 0807   HDL 48.50 09/09/2017 0807   CHOLHDL 4 09/09/2017 0807   VLDL 35.8 09/09/2017 0807   LDLCALC 125 (H) 09/09/2017 0807   LDLDIRECT 131.0 01/08/2015 0849    Physical Exam:    VS:  BP  (!) 152/80 (BP Location: Right Arm, Patient Position: Sitting, Cuff Size: Normal)   Pulse 78   Ht 6' 2.25" (1.886 m)   Wt (!) 358 lb (162.4 kg)   SpO2 98%   BMI 45.66 kg/m     Wt Readings from Last 3 Encounters:  09/13/18 (!) 358 lb (162.4 kg)  07/02/18 Marland Kitchen(!)  355 lb (161 kg)  04/08/18 (!) 355 lb (161 kg)     GEN: Patient is in no acute distress HEENT: Normal NECK: No JVD; No carotid bruits LYMPHATICS: No lymphadenopathy CARDIAC: S1 S2 regular, 2/6 systolic murmur at the apex. RESPIRATORY:  Clear to auscultation without rales, wheezing or rhonchi  ABDOMEN: Soft, non-tender, non-distended MUSCULOSKELETAL:  No edema; No deformity  SKIN: Warm and dry NEUROLOGIC:  Alert and oriented x 3 PSYCHIATRIC:  Normal affect    Signed, Garwin Brothers, MD  09/13/2018 4:04 PM    [facility]

## 2018-10-12 ENCOUNTER — Encounter: Payer: Self-pay | Admitting: Family Medicine

## 2019-02-24 ENCOUNTER — Telehealth (HOSPITAL_COMMUNITY): Payer: Self-pay | Admitting: Emergency Medicine

## 2019-02-24 NOTE — Telephone Encounter (Signed)
Left message on voicemail with name and callback number Tarrance Januszewski RN Navigator Cardiac Imaging [facility] [phone] Office [phone] Cell  

## 2019-02-28 ENCOUNTER — Ambulatory Visit (HOSPITAL_COMMUNITY)
Admission: RE | Admit: 2019-02-28 | Discharge: 2019-02-28 | Disposition: A | Discharge status: Home / Self Care | Payer: Managed Care, Other (non HMO) | Source: Ambulatory Visit | Attending: Cardiology | Admitting: Cardiology

## 2019-02-28 ENCOUNTER — Encounter (HOSPITAL_COMMUNITY): Payer: Self-pay

## 2019-02-28 ENCOUNTER — Other Ambulatory Visit: Payer: Self-pay

## 2019-02-28 ENCOUNTER — Ambulatory Visit (HOSPITAL_COMMUNITY): Admission: RE | Admit: 2019-02-28 | Payer: Managed Care, Other (non HMO) | Source: Ambulatory Visit

## 2019-02-28 DIAGNOSIS — I209 Angina pectoris, unspecified: Secondary | ICD-10-CM | POA: Insufficient documentation

## 2019-02-28 IMAGING — CT CT HEAR MORPH WITH CTA COR WITH SCORE WITH CA WITH CONTRAST AND
4 of 7 series · 8 of 20 positions shown, 9 images · IV contrast (APPLIED)
Comparison: None.
COMPARISON: None.

Addendum:
EXAM:
OVER-READ INTERPRETATION  CT CHEST

The following report is an over-read performed by radiologist Dr.
Lorraine Jim [[email or web address]] on 02/28/2019. This over-read
does not include interpretation of cardiac or coronary anatomy or
pathology. The coronary CTA interpretation by the cardiologist is
attached.
CLINICAL DATA: Chest pain
Cardiac CTA
MEDICATIONS:
Sub lingual nitro. 4mg x 2
TECHNIQUE: The patient was scanned on a Siemens [REDACTED]ice scanner. Gantry
rotation speed was 250 msecs. Collimation was 0.6 mm. A 100 kV
prospective scan was triggered in the ascending thoracic aorta at
35-75% of the R-R interval. Average HR during the scan was 60 bpm.
The 3D data set was interpreted on a dedicated work station using
MPR, MIP and VRT modes. A total of 80cc of contrast was used.

[Series 7: best diast 73 % · axial · 0.48mm/px · z∈[+1283,+1335]mm · 2 of 392 slices shown, 3 images]
[im 131/392  vessel]
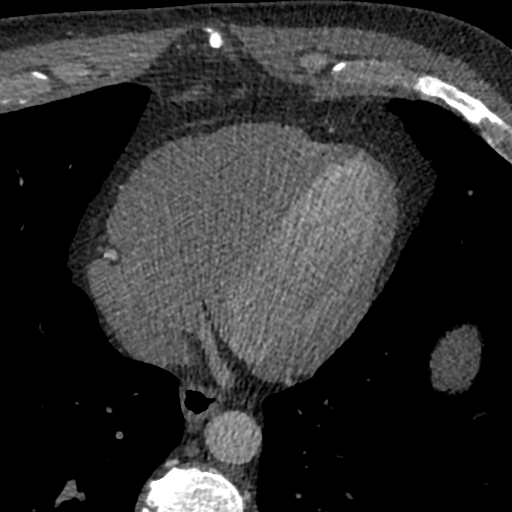
[im 131/392  lung]
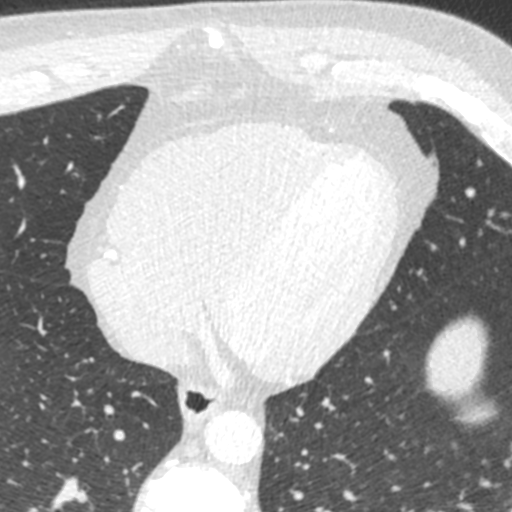
[im 261/392  vessel]
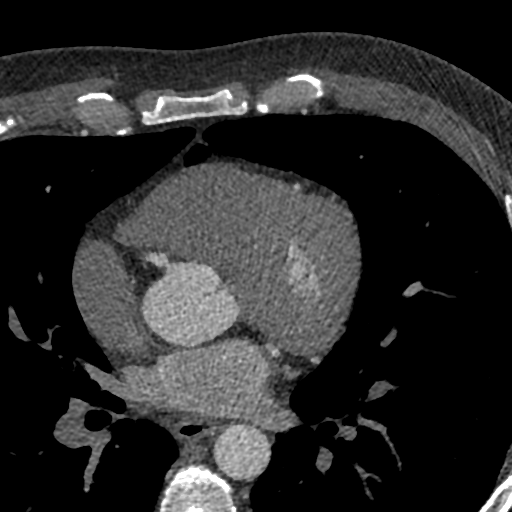

[Series 8: best syst 35 % · axial · 0.48mm/px · z∈[+1283,+1335]mm · 2 of 392 slices shown]
[im 131/392  vessel]
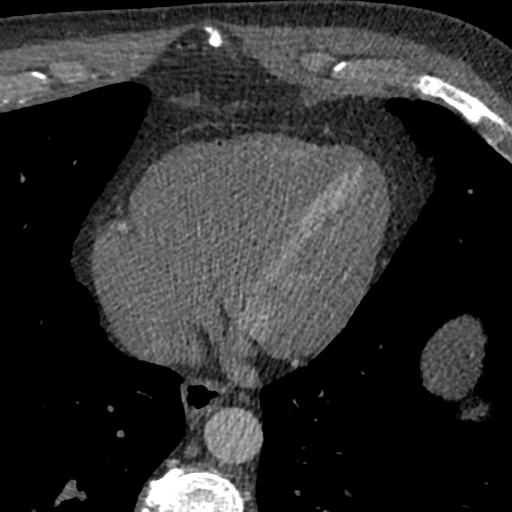
[im 261/392  vessel]
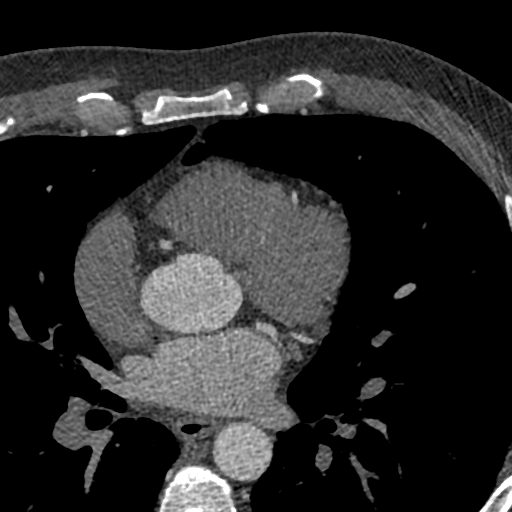

[Series 9: ts diast sharp 73 % · axial · 0.48mm/px · z∈[+1283,+1335]mm · 2 of 392 slices shown]
[im 131/392  lung]
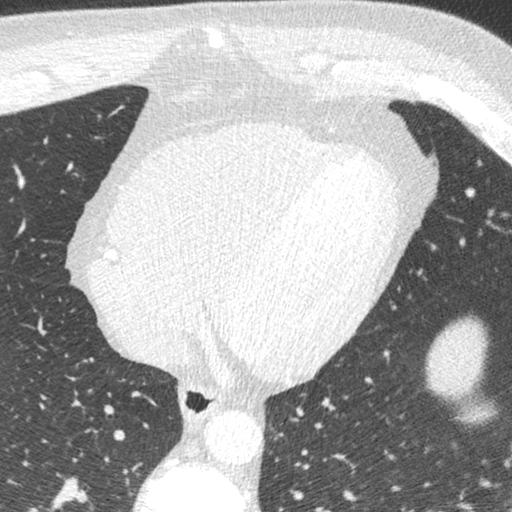
[im 261/392  lung]
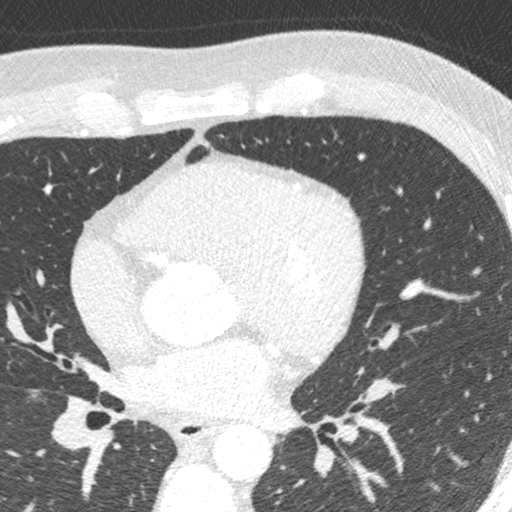

[Series 10: ts syst sharp 35 % · axial · 0.48mm/px · z∈[+1283,+1335]mm · 2 of 392 slices shown]
[im 131/392  lung]
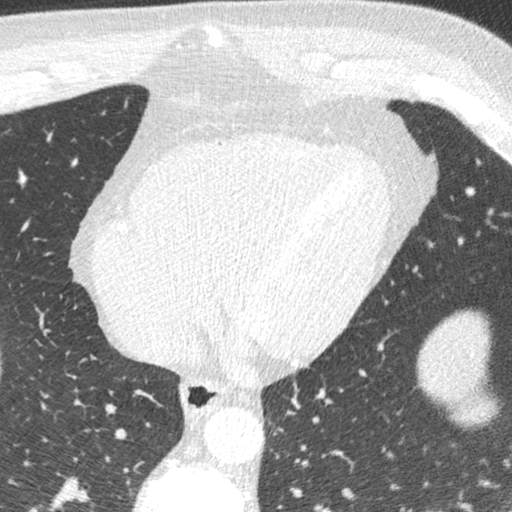
[im 261/392  lung]
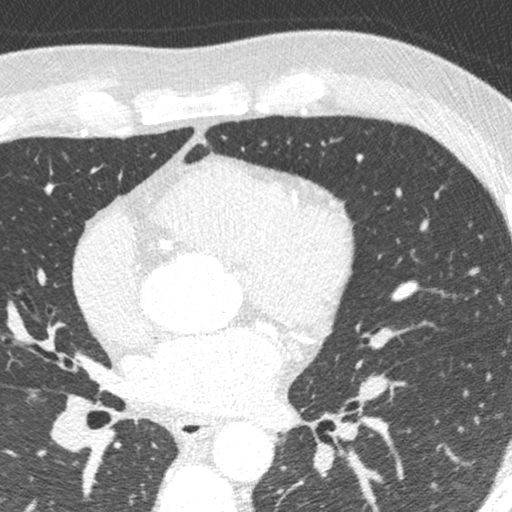

[8 of 20 positions shown; findings below may reference images not displayed]

FINDINGS: Vascular: Heart is normal size.  Visualized aorta is normal caliber.

Mediastinum/Nodes: Calcified lymph nodes in the subcarinal region
and right hilum. No adenopathy.

Lungs/Pleura: 7 mm nodule on image 14. 6 mm nodule in the right
lower lobe on image 17. No other confluent opacities or effusions.

Upper Abdomen: Imaging into the upper abdomen shows no acute
findings.

Musculoskeletal: Chest wall soft tissues are unremarkable. No acute
bony abnormality.
IMPRESSION: Pulmonary nodules in the right upper and lower lobes, the largest 7
mm. Non-contrast chest CT at 3-6 months is recommended. If the
nodules are stable at time of repeat CT, then future CT at 18-24
months (from today's scan) is considered optional for low-risk
patients, but is recommended for high-risk patients. This
recommendation follows the consensus statement: Guidelines for
Management of Incidental Pulmonary Nodules Detected on CT Images:
FINDINGS: Non-cardiac: See separate report from [REDACTED].

Pulmonary veins drain normally to the left atrium.

Calcium Score: 64 Agatston units.

Coronary Arteries: Right dominant with no anomalies

LM: Mixed plaque, no significant stenosis.

LAD system: No plaque or stenosis.

Circumflex system: Mixed plaque mid LCx, minimal stenosis.

RCA system: Mixed plaque proximal, mid, and distal RCA. Probably no
more than mild (<50%) stenosis most prominently in the mid RCA.
IMPRESSION: 1. Coronary artery calcium score 64 Agatston units. This places the
patient in the 93rd percentile for age and gender, suggesting high
risk for future cardiac events.

2. Nonobstructive coronary disease. Will send for FFR to confirm
nonobstructive RCA disease.

Reny Esparza

*** End of Addendum ***
EXAM:
OVER-READ INTERPRETATION  CT CHEST

The following report is an over-read performed by radiologist Dr.
Lorraine Jim [REDACTED] on 02/28/2019. This over-read
does not include interpretation of cardiac or coronary anatomy or
pathology. The coronary CTA interpretation by the cardiologist is
attached.
FINDINGS: Vascular: Heart is normal size.  Visualized aorta is normal caliber.

Mediastinum/Nodes: Calcified lymph nodes in the subcarinal region
and right hilum. No adenopathy.

Lungs/Pleura: 7 mm nodule on image 14. 6 mm nodule in the right
lower lobe on image 17. No other confluent opacities or effusions.

Upper Abdomen: Imaging into the upper abdomen shows no acute
findings.

Musculoskeletal: Chest wall soft tissues are unremarkable. No acute
bony abnormality.
IMPRESSION: Pulmonary nodules in the right upper and lower lobes, the largest 7
mm. Non-contrast chest CT at 3-6 months is recommended. If the
nodules are stable at time of repeat CT, then future CT at 18-24
months (from today's scan) is considered optional for low-risk
patients, but is recommended for high-risk patients. This
recommendation follows the consensus statement: Guidelines for
Management of Incidental Pulmonary Nodules Detected on CT Images:

## 2019-02-28 MED ORDER — NITROGLYCERIN 0.4 MG SL SUBL
0.8000 mg | SUBLINGUAL_TABLET | Freq: Once | SUBLINGUAL | Status: AC
  Administered 2019-02-28: 0.8 mg via SUBLINGUAL

## 2019-02-28 MED ORDER — NITROGLYCERIN 0.4 MG SL SUBL
SUBLINGUAL_TABLET | SUBLINGUAL | Status: AC
  Administered 2019-02-28: 0.8 mg via SUBLINGUAL
  Filled 2019-02-28: qty 1

## 2019-02-28 MED ORDER — IOHEXOL 350 MG/ML SOLN
80.0000 mL | Freq: Once | INTRAVENOUS | Status: AC | PRN
  Administered 2019-02-28: 80 mL via INTRAVENOUS

## 2019-02-28 MED ORDER — METOPROLOL TARTRATE 5 MG/5ML IV SOLN
5.0000 mg | INTRAVENOUS | Status: AC | PRN
  Administered 2019-02-28 (×4): 5 mg via INTRAVENOUS

## 2019-02-28 MED ORDER — METOPROLOL TARTRATE 5 MG/5ML IV SOLN
INTRAVENOUS | Status: AC
  Administered 2019-02-28: 5 mg via INTRAVENOUS
  Filled 2019-02-28: qty 20

## 2019-03-09 ENCOUNTER — Telehealth: Payer: Self-pay

## 2019-03-09 DIAGNOSIS — Z13220 Encounter for screening for lipoid disorders: Secondary | ICD-10-CM

## 2019-03-09 DIAGNOSIS — R9430 Abnormal result of cardiovascular function study, unspecified: Secondary | ICD-10-CM

## 2019-03-09 DIAGNOSIS — I2510 Atherosclerotic heart disease of native coronary artery without angina pectoris: Secondary | ICD-10-CM

## 2019-03-09 MED ORDER — ASPIRIN EC 81 MG PO TBEC: 81.0000 mg | DELAYED_RELEASE_TABLET | Freq: Every day | ORAL | 3 refills | Status: DC

## 2019-03-09 NOTE — Telephone Encounter (Signed)
D.Edgerrin Correia spoke with pt about his procedure results and coming in for f/u labs. He is in agreement with starting 81 mg aspirin daily.Patient also states he will come into [facility] office this week for fasting labs. PCP in Epic was incorrect and changed per pt request. Copy of results sent to Dr. Curly Rim per Dr. Docia Furl request. Message left for Dr. Curly Rim office to return call concerning patient nodules and f/u.

## 2019-03-09 NOTE — Telephone Encounter (Signed)
RN spoke with Scott Norris (Dr. Otilio Carpen nurse) information relayed with no further questions.

## 2019-03-09 NOTE — Telephone Encounter (Signed)
-----   Message from Jenean Lindau, MD sent at 03/01/2019  4:43 PM EDT ----- Please get the patient in for a Chem-7 and a liver lipid check.  Make sure this has not been done recently.  Please explain about the report with calcifications in the coronaries.  He also should start enteric-coated aspirin 81 mg daily.  Please talk to his primary care physician's nurse and tell them about his abnormal lung nodule and send them a copy of this report and let them know and let the patient also know that they will be following this aspect of his care.  We will deal with a cardiovascular issue only. Jenean Lindau, MD 03/01/2019 4:42 PM

## 2019-03-11 ENCOUNTER — Telehealth: Payer: Self-pay | Admitting: Cardiology

## 2019-03-11 NOTE — Telephone Encounter (Signed)
Patient says insurance is stating he is needing more testing. Please advise

## 2019-03-15 NOTE — Telephone Encounter (Signed)
Left message for patient to call back with more details concerning insurance request.

## 2019-04-09 LAB — LIPID PANEL
Chol/HDL Ratio: 5 ratio (ref 0.0–5.0)
Cholesterol, Total: 214 mg/dL — ABNORMAL HIGH (ref 100–199)
HDL: 43 mg/dL (ref >39)
LDL Calculated: 110 mg/dL — ABNORMAL HIGH (ref 0–99)
Triglycerides: 303 mg/dL — ABNORMAL HIGH (ref 0–149)
VLDL Cholesterol Cal: 61 mg/dL — ABNORMAL HIGH (ref 5–40)

## 2019-04-09 LAB — HEPATIC FUNCTION PANEL
ALT: 37 IU/L (ref 0–44)
AST: 25 IU/L (ref 0–40)
Albumin: 4.4 g/dL (ref 4.0–5.0)
Alkaline Phosphatase: 69 IU/L (ref 39–117)
Bilirubin Total: 0.4 mg/dL (ref 0.0–1.2)
Bilirubin, Direct: 0.08 mg/dL (ref 0.00–0.40)
Total Protein: 6.7 g/dL (ref 6.0–8.5)

## 2019-04-09 LAB — BASIC METABOLIC PANEL
BUN/Creatinine Ratio: 15 (ref 9–20)
BUN: 17 mg/dL (ref 6–24)
CO2: 21 mmol/L (ref 20–29)
Calcium: 9.3 mg/dL (ref 8.7–10.2)
Chloride: 101 mmol/L (ref 96–106)
Creatinine, Ser: 1.1 mg/dL (ref 0.76–1.27)
GFR calc Af Amer: 95 mL/min/{1.73_m2} (ref >59)
GFR calc non Af Amer: 82 mL/min/{1.73_m2} (ref >59)
Glucose: 97 mg/dL (ref 65–99)
Potassium: 4.5 mmol/L (ref 3.5–5.2)
Sodium: 135 mmol/L (ref 134–144)

## 2019-04-18 ENCOUNTER — Telehealth: Payer: Self-pay

## 2019-04-18 DIAGNOSIS — Z8249 Family history of ischemic heart disease and other diseases of the circulatory system: Secondary | ICD-10-CM

## 2019-04-18 DIAGNOSIS — Z13220 Encounter for screening for lipoid disorders: Secondary | ICD-10-CM

## 2019-04-18 NOTE — Telephone Encounter (Signed)
Information relayed, RN expressed pt cutdown on carbohydrates and high fat meats. He was in agreement and will come back in 3 mo for repeat labs.

## 2020-11-14 ENCOUNTER — Other Ambulatory Visit: Payer: Self-pay | Admitting: Gastroenterology

## 2020-11-14 DIAGNOSIS — R1010 Upper abdominal pain, unspecified: Secondary | ICD-10-CM

## 2020-11-30 ENCOUNTER — Ambulatory Visit
Admission: RE | Admit: 2020-11-30 | Discharge: 2020-11-30 | Disposition: A | Discharge status: Home / Self Care | Payer: Managed Care, Other (non HMO) | Source: Ambulatory Visit | Attending: Gastroenterology | Admitting: Gastroenterology

## 2020-11-30 DIAGNOSIS — R1010 Upper abdominal pain, unspecified: Secondary | ICD-10-CM

## 2021-03-21 ENCOUNTER — Ambulatory Visit (INDEPENDENT_AMBULATORY_CARE_PROVIDER_SITE_OTHER): Payer: 59 | Admitting: Pulmonary Disease

## 2021-03-21 ENCOUNTER — Other Ambulatory Visit: Payer: Self-pay

## 2021-03-21 VITALS — BP 148/90 | HR 84 | Ht 75.0 in | Wt 369.4 lb

## 2021-03-21 DIAGNOSIS — R918 Other nonspecific abnormal finding of lung field: Secondary | ICD-10-CM

## 2021-03-21 DIAGNOSIS — R059 Cough, unspecified: Secondary | ICD-10-CM | POA: Diagnosis not present

## 2021-03-21 DIAGNOSIS — R079 Chest pain, unspecified: Secondary | ICD-10-CM | POA: Diagnosis not present

## 2021-03-21 DIAGNOSIS — R0789 Other chest pain: Secondary | ICD-10-CM | POA: Diagnosis not present

## 2021-03-21 NOTE — Patient Instructions (Addendum)
We will schedule you for CT chest scan to further evaluate your lungs and ribs due to the left sided discomfort.   Continue to work on weight loss with increased physical activity with resistance training and walking throughout the week

## 2021-03-21 NOTE — Progress Notes (Signed)
Synopsis: Referred in June 2022 for chest pain and shortness of breath by [name], PA  Subjective:   PATIENT ID: Scott Norris GENDER: male DOB: [birth year], MRN: [id number]   HPI  Chief Complaint  Patient presents with   Consult    Chest pains x 3 years.  Worsen after he eats, feels like pressure on lungs.    Scott Norris is a 45 year old male, never smoker with history of GERD, obstructive sleep apnea and obesity who is referred to pulmonary clinic for chest pressure.   He reports left-sided chest pressure that is intermittent in nature, and more prevalent after eating meals.  He also has increased incidence of cough when the chest pressure is present.  He describes the chest pressure as a knot on the left side under his last rib.  He has been evaluated by gastroenterology and cardiology in the past.  He has been trialed on various PPI treatments and has noticed improvement with pantoprazole.  He was trialed on Symbicort 1 60-21 MCG 2 puffs twice daily without improvement in the chest discomfort or cough.  He had CT coronary scan 02/28/2019 which showed a 7 mm nodule and 6 mm nodule in the right lower lobe.  He had myocardial perfusion scan 01/07/2017 which was deemed a low risk study.   He denies any significant shortness of breath that limits his day-to-day functions.  He reports his weight has been stable over the last 3 years he is noted this left-sided chest discomfort.  He has history of obstructive sleep apnea and is on a CPAP machine.  He has not followed with a sleep provider in about 3 years.  Past Medical History:  Diagnosis Date   Family history of prostate cancer    Father   GERD (gastroesophageal reflux disease)    Gouty arthritis    Atypical: L [place] joint   History of chest pain 09/2013   Ruled out for ACS at [facility] ED.  Dx'd with musculoskeletal CP, costochondritis, and GERD for c/o CP in the past.  ETT normal 06/2015.  Dr. Rennis Golden eval 12/2016-->low risk myoc  perf imaging.  08/2018 re-eval->plan for CT coronary fractional flow reserve testing.   Hypertriglyceridemia    Trigs>1400 in 2012: lovaza rx'd   Morbid obesity (HCC) 12/2014   BMI 49   OSA on CPAP    Sternoclavicular joint pain, left 05/2017   w/intermittent swelling: admitted 05/2017 for MRI result suggestive of possible osteomyelitis of left [place] joint: joint fluid analysis did not support dx of infection.  Some autoimmune/rheum labs pending.  ? Gout (empiric treatment started)---ortho f/u also felt like this is gout.     Family History  Problem Relation Age of Onset   Prostate cancer Father 12   Diabetes Father    Hypertension Father      Social History   Socioeconomic History   Marital status: Single    Spouse name: Not on file   Number of children: Not on file   Years of education: Not on file   Highest education level: Not on file  Occupational History   Not on file  Tobacco Use   Smoking status: Never   Smokeless tobacco: Former    Types: Snuff  Vaping Use   Vaping Use: Never used  Substance and Sexual Activity   Alcohol use: Yes    Alcohol/week: 2.0 standard drinks    Types: 2 Shots of liquor per week    Comment: 4-5 weekly  Drug use: No   Sexual activity: Not on file  Other Topics Concern   Not on file  Social History Narrative   Married, 2 y/o son.   Occupation: Scientist, physiological.   College: [employer]: BS.  Also played football for [employer].   No tobacco.  Drinks 2 bourbon drinks 4-5 days a week.   Dips tobacco.         Social Determinants of Health   Financial Resource Strain: Not on file  Food Insecurity: Not on file  Transportation Needs: Not on file  Physical Activity: Not on file  Stress: Not on file  Social Connections: Not on file  Intimate Partner Violence: Not on file     Allergies  Allergen Reactions   Naproxen Nausea And Vomiting and Other (See Comments)    Stomach pain     Outpatient Medications  Prior to Visit  Medication Sig Dispense Refill   aspirin EC 81 MG tablet Take 1 tablet (81 mg total) by mouth daily. 90 tablet 3   levocetirizine (XYZAL) 5 MG tablet Take 5 mg by mouth every evening.     pantoprazole (PROTONIX) 40 MG tablet Take by mouth.     PRESCRIPTION MEDICATION Inhale into the lungs at bedtime. CPAP     metoprolol tartrate (LOPRESSOR) 50 MG tablet Take 1 tablet (50 mg total) by mouth once for 1 dose. Take 2 hours before cardiac CTA. 2 tablet 0   nitroGLYCERIN (NITROSTAT) 0.4 MG SL tablet Place 1 tablet (0.4 mg total) under the tongue every 5 (five) minutes as needed for chest pain. 25 tablet 6   No facility-administered medications prior to visit.    Review of Systems  Constitutional:  Negative for chills, fever, malaise/fatigue and weight loss.  HENT:  Negative for congestion, sinus pain and sore throat.   Eyes: Negative.   Respiratory:  Positive for cough and shortness of breath. Negative for hemoptysis, sputum production and wheezing.   Cardiovascular:  Positive for chest pain. Negative for palpitations, orthopnea, claudication and leg swelling.  Gastrointestinal:  Negative for abdominal pain, heartburn, nausea and vomiting.  Genitourinary: Negative.   Musculoskeletal:  Negative for joint pain and myalgias.  Skin:  Negative for rash.  Neurological:  Negative for weakness.  Endo/Heme/Allergies: Negative.   Psychiatric/Behavioral: Negative.       Objective:   Vitals:   03/21/21 1546  BP: (!) 148/90  Pulse: 84  SpO2: 97%  Weight: (!) 369 lb 6.4 oz (167.6 kg)  Height: 6\' 3"  (1.905 m)     Physical Exam Constitutional:      General: He is not in acute distress.    Appearance: He is obese.  HENT:     Head: Normocephalic and atraumatic.  Eyes:     Extraocular Movements: Extraocular movements intact.     Conjunctiva/sclera: Conjunctivae normal.     Pupils: Pupils are equal, round, and reactive to light.  Cardiovascular:     Rate and Rhythm: Normal  rate and regular rhythm.     Pulses: Normal pulses.     Heart sounds: Normal heart sounds. No murmur heard. Pulmonary:     Effort: Pulmonary effort is normal.     Breath sounds: Normal breath sounds. No wheezing, rhonchi or rales.  Abdominal:     General: Bowel sounds are normal.     Palpations: Abdomen is soft.  Musculoskeletal:        General: Tenderness (mild, under last rib on left anterior) present.  Right lower leg: No edema.     Left lower leg: No edema.  Lymphadenopathy:     Cervical: No cervical adenopathy.  Skin:    General: Skin is warm and dry.  Neurological:     General: No focal deficit present.     Mental Status: He is alert.  Psychiatric:        Mood and Affect: Mood normal.        Behavior: Behavior normal.        Thought Content: Thought content normal.        Judgment: Judgment normal.    CBC    Component Value Date/Time   WBC 6.1 09/09/2017 0807   RBC 4.64 09/09/2017 0807   HGB 14.9 09/09/2017 0807   HCT 45.1 09/09/2017 0807   PLT 283.0 09/09/2017 0807   MCV 97.2 09/09/2017 0807   MCH 31.1 06/08/2017 0959   MCHC 33.1 09/09/2017 0807   RDW 13.6 09/09/2017 0807   LYMPHSABS 1.5 09/09/2017 0807   MONOABS 0.6 09/09/2017 0807   EOSABS 0.2 09/09/2017 0807   BASOSABS 0.0 09/09/2017 0807   BMP Latest Ref Rng & Units 04/08/2019 09/09/2017 06/08/2017  Glucose 65 - 99 mg/dL 97 536(U103(H) 99  BUN 6 - 24 mg/dL 17 16 13   Creatinine 0.76 - 1.27 mg/dL 4.401.10 3.471.02 4.251.12  BUN/Creat Ratio 9 - 20 15 - -  Sodium 134 - 144 mmol/L 135 138 135  Potassium 3.5 - 5.2 mmol/L 4.5 4.5 4.2  Chloride 96 - 106 mmol/L 101 105 103  CO2 20 - 29 mmol/L 21 28 25   Calcium 8.7 - 10.2 mg/dL 9.3 9.0 9.3   Chest imaging: CT Coronary Scan 02/28/2019 Pulmonary nodules in the right upper and lower lobes, the largest 7 mm. No musculoskeletal abnormality.  PFT: No flowsheet data found.  Myocardial Perfusion Scan 2018 The left ventricular ejection fraction is normal (55-65%). Nuclear  stress EF: 55%. Blood pressure demonstrated a hypertensive response to exercise. There was no ST segment deviation noted during stress. There were occasional PVCs vs. PACs with aberration noted. There is a medium defect of moderate severity present in the basal inferior, mid inferior and apical inferior location. This is partially reversible and in the setting of normal LVF, likely represents variations in diaphragmatic attenuation artifact This is a low risk study.  Assessment & Plan:   Chest pressure  Chest pain, unspecified type - Plan: CT Chest Wo Contrast  Cough - Plan: CT Chest Wo Contrast  Pulmonary nodules  Discussion: Lubertha SouthLarry Keefe is a 45 year old male, never smoker with history of GERD, obstructive sleep apnea and obesity who is referred to pulmonary clinic for chest pressure.   The etiology of his left-sided chest pressure is uncertain at this time.  I think it is most likely due to anatomical compression of his diaphragms that is noted after eating meals.  We discussed the importance of weight loss to reduce his abdominal girth which would allow full movement of his diaphragms.  We will obtain a CT chest to further evaluate his chest pressure, cough and the pulmonary nodules that were noted on his previous CT imaging from 2020.  He has a new Bowflex machine at home I have encouraged him to use on a frequent basis each week I have also encouraged him to take walks and increase the length of each walk over time slowly to allow for gradual weight loss.  He will need referral to a sleep specialist in the future which we  will arrange at the next follow-up visit.  Follow-up in 2 months.  Melody Comas, MD [facility] Office: [phone]   Current Outpatient Medications:    aspirin EC 81 MG tablet, Take 1 tablet (81 mg total) by mouth daily., Disp: 90 tablet, Rfl: 3   levocetirizine (XYZAL) 5 MG tablet, Take 5 mg by mouth every evening., Disp: ,  Rfl:    pantoprazole (PROTONIX) 40 MG tablet, Take by mouth., Disp: , Rfl:    PRESCRIPTION MEDICATION, Inhale into the lungs at bedtime. CPAP, Disp: , Rfl:    metoprolol tartrate (LOPRESSOR) 50 MG tablet, Take 1 tablet (50 mg total) by mouth once for 1 dose. Take 2 hours before cardiac CTA., Disp: 2 tablet, Rfl: 0   nitroGLYCERIN (NITROSTAT) 0.4 MG SL tablet, Place 1 tablet (0.4 mg total) under the tongue every 5 (five) minutes as needed for chest pain., Disp: 25 tablet, Rfl: 6

## 2021-04-05 ENCOUNTER — Telehealth: Payer: Self-pay | Admitting: Pulmonary Disease

## 2021-04-05 NOTE — Telephone Encounter (Addendum)
Auth was received and I called [facility] and spoke to [place].  She was going to call pt to schedule.  Called Renee and she states tech sent order back to her due to pt needing contrast??   She states she will call pt now to schedule. I called pt & made him aware we had to get CT authorized before CT could be scheduled and auth was received on 7/13.  I told him I spoke to [place] at [facility] in [facility] and she would be calling him to schedule.  I also gave him their phone #.  Nothing further needed.

## 2021-04-05 NOTE — Telephone Encounter (Signed)
Pt states that it has been two weeks since his OV and that he hasn't heard when he is to be scheduled for his Chest CT. I explained that the order has been placed and that the PCCs would be giving him a call, but he is UNHAPPY that it has takent two weeks and states that if he doesn't receive a call telling him what's going on, that he will come to the office next week and demand to speak to someone in person. Please advise.

## 2021-04-10 ENCOUNTER — Ambulatory Visit (HOSPITAL_BASED_OUTPATIENT_CLINIC_OR_DEPARTMENT_OTHER)
Admission: RE | Admit: 2021-04-10 | Discharge: 2021-04-10 | Disposition: A | Discharge status: Home / Self Care | Payer: 59 | Source: Ambulatory Visit | Attending: Pulmonary Disease | Admitting: Pulmonary Disease

## 2021-04-10 ENCOUNTER — Other Ambulatory Visit: Payer: Self-pay

## 2021-04-10 DIAGNOSIS — R059 Cough, unspecified: Secondary | ICD-10-CM | POA: Diagnosis present

## 2021-04-10 DIAGNOSIS — R079 Chest pain, unspecified: Secondary | ICD-10-CM | POA: Diagnosis present

## 2021-06-03 ENCOUNTER — Other Ambulatory Visit: Payer: Self-pay

## 2021-06-03 ENCOUNTER — Encounter: Payer: Self-pay | Admitting: Pulmonary Disease

## 2021-06-03 ENCOUNTER — Ambulatory Visit (INDEPENDENT_AMBULATORY_CARE_PROVIDER_SITE_OTHER): Payer: 59 | Admitting: Pulmonary Disease

## 2021-06-03 VITALS — BP 136/84 | HR 63 | Ht 75.0 in | Wt 366.0 lb

## 2021-06-03 DIAGNOSIS — R0789 Other chest pain: Secondary | ICD-10-CM

## 2021-06-03 DIAGNOSIS — R918 Other nonspecific abnormal finding of lung field: Secondary | ICD-10-CM | POA: Diagnosis not present

## 2021-06-03 NOTE — Progress Notes (Signed)
Synopsis: Referred in June 2022 for chest pain and shortness of breath by Tera HelperBrent Breedlove, PA  Subjective:   PATIENT ID: Scott Norris GENDER: male DOB: 09-17-1976, MRN: [id number]   HPI  Chief Complaint  Patient presents with   Follow-up    2 month f/u for chest pressure. States the pressure has not changed since last visit. Still noticing the pressure immediately after meals.     Scott Norris is a 45 year old male, never smoker with history of GERD, obstructive sleep apnea and obesity who returns to pulmonary clinic for chest pressure.   He had CT Chest scan which shows increased amount of visceral fat with close proximity of his stomach to the left edge of the liver right underneath the left diaphragm. He is also noted to have calcified subcarinal lymph node which is stable from 2017.   He continues to have the same left sided chest discomfort when eating.  OV 03/21/21 He reports left-sided chest pressure that is intermittent in nature, and more prevalent after eating meals.  He also has increased incidence of cough when the chest pressure is present.  He describes the chest pressure as a knot on the left side under his last rib.  He has been evaluated by gastroenterology and cardiology in the past.  He has been trialed on various PPI treatments and has noticed improvement with pantoprazole.  He was trialed on Symbicort 1 60-21 MCG 2 puffs twice daily without improvement in the chest discomfort or cough.  He had CT coronary scan 02/28/2019 which showed a 7 mm nodule and 6 mm nodule in the right lower lobe.  He had myocardial perfusion scan 01/07/2017 which was deemed a low risk study.   He denies any significant shortness of breath that limits his day-to-day functions.  He reports his weight has been stable over the last 3 years he is noted this left-sided chest discomfort.  He has history of obstructive sleep apnea and is on a CPAP machine.  He has not followed with a sleep  provider in about 3 years.  Past Medical History:  Diagnosis Date   Family history of prostate cancer    Father   GERD (gastroesophageal reflux disease)    Gouty arthritis    Atypical: L [place] joint   History of chest pain 09/2013   [facility]  Dx'd with musculoskeletal CP, costochondritis, and GERD for c/o CP in the past.  ETT normal 06/2015.  Dr. Rennis GoldenHilty eval 12/2016-->low risk myoc perf imaging.  08/2018 re-eval->plan for CT coronary fractional flow reserve testing.   Hypertriglyceridemia    Trigs>1400 in 2012: lovaza rx'd   Morbid obesity (HCC) 12/2014   BMI 49   OSA on CPAP    Sternoclavicular joint pain, left 05/2017   w/intermittent swelling: admitted 05/2017 for MRI result suggestive of possible osteomyelitis of left [place] joint: joint fluid analysis did not support dx of infection.  Some autoimmune/rheum labs pending.  ? Gout (empiric treatment started)---ortho f/u also felt like this is gout.     Family History  Problem Relation Age of Onset   Prostate cancer Father 263   Diabetes Father    Hypertension Father      Social History   Socioeconomic History   Marital status: Single    Spouse name: Not on file   Number of children: Not on file   Years of education: Not on file   Highest education level: Not on file  Occupational History   Not on file  Tobacco Use   Smoking status: Never   Smokeless tobacco: Former    Types: Snuff  Vaping Use   Vaping Use: Never used  Substance and Sexual Activity   Alcohol use: Yes    Alcohol/week: 2.0 standard drinks    Types: 2 Shots of liquor per week    Comment: 4-5 weekly   Drug use: No   Sexual activity: Not on file  Other Topics Concern   Not on file  Social History Narrative   Married, 2 y/o son.   Occupation: Scientist, physiological.   College: [employer]: BS.  Also played football for [employer].   No tobacco.  Drinks 2 bourbon drinks 4-5 days a week.   Dips tobacco.          Social Determinants of Health   Financial Resource Strain: Not on file  Food Insecurity: Not on file  Transportation Needs: Not on file  Physical Activity: Not on file  Stress: Not on file  Social Connections: Not on file  Intimate Partner Violence: Not on file     Allergies  Allergen Reactions   Naproxen Nausea And Vomiting and Other (See Comments)    Stomach pain     Outpatient Medications Prior to Visit  Medication Sig Dispense Refill   pantoprazole (PROTONIX) 40 MG tablet Take by mouth.     PRESCRIPTION MEDICATION Inhale into the lungs at bedtime. CPAP     aspirin EC 81 MG tablet Take 1 tablet (81 mg total) by mouth daily. 90 tablet 3   levocetirizine (XYZAL) 5 MG tablet Take 5 mg by mouth every evening.     metoprolol tartrate (LOPRESSOR) 50 MG tablet Take 1 tablet (50 mg total) by mouth once for 1 dose. Take 2 hours before cardiac CTA. 2 tablet 0   nitroGLYCERIN (NITROSTAT) 0.4 MG SL tablet Place 1 tablet (0.4 mg total) under the tongue every 5 (five) minutes as needed for chest pain. 25 tablet 6   No facility-administered medications prior to visit.    Review of Systems  Constitutional:  Negative for chills, fever, malaise/fatigue and weight loss.  HENT:  Negative for congestion, sinus pain and sore throat.   Eyes: Negative.   Respiratory:  Negative for cough, hemoptysis, sputum production, shortness of breath and wheezing.   Cardiovascular:  Positive for chest pain (pressure). Negative for palpitations, orthopnea, claudication and leg swelling.  Gastrointestinal:  Negative for abdominal pain, heartburn, nausea and vomiting.  Genitourinary: Negative.   Musculoskeletal:  Negative for joint pain and myalgias.  Skin:  Negative for rash.  Neurological:  Negative for weakness.  Endo/Heme/Allergies: Negative.   Psychiatric/Behavioral: Negative.       Objective:   Vitals:   06/03/21 1610  BP: 136/84  Pulse: 63  SpO2: 98%  Weight: (!) 366 lb (166 kg)  Height:  6\' 3"  (1.905 m)     Physical Exam Constitutional:      General: He is not in acute distress.    Appearance: He is obese.  HENT:     Head: Normocephalic and atraumatic.  Eyes:     Extraocular Movements: Extraocular movements intact.     Conjunctiva/sclera: Conjunctivae normal.     Pupils: Pupils are equal, round, and reactive to light.  Cardiovascular:     Rate and Rhythm: Normal rate and regular rhythm.     Pulses: Normal pulses.     Heart sounds: Normal heart sounds. No murmur heard.  Pulmonary:     Effort: Pulmonary effort is normal.     Breath sounds: Normal breath sounds. No wheezing, rhonchi or rales.  Abdominal:     General: Bowel sounds are normal.     Palpations: Abdomen is soft.  Musculoskeletal:        General: No tenderness.     Right lower leg: No edema.     Left lower leg: No edema.  Lymphadenopathy:     Cervical: No cervical adenopathy.  Skin:    General: Skin is warm and dry.  Neurological:     General: No focal deficit present.     Mental Status: He is alert.  Psychiatric:        Mood and Affect: Mood normal.        Behavior: Behavior normal.        Thought Content: Thought content normal.        Judgment: Judgment normal.    CBC    Component Value Date/Time   WBC 6.1 09/09/2017 0807   RBC 4.64 09/09/2017 0807   HGB 14.9 09/09/2017 0807   HCT 45.1 09/09/2017 0807   PLT 283.0 09/09/2017 0807   MCV 97.2 09/09/2017 0807   MCH 31.1 06/08/2017 0959   MCHC 33.1 09/09/2017 0807   RDW 13.6 09/09/2017 0807   LYMPHSABS 1.5 09/09/2017 0807   MONOABS 0.6 09/09/2017 0807   EOSABS 0.2 09/09/2017 0807   BASOSABS 0.0 09/09/2017 0807   BMP Latest Ref Rng & Units 04/08/2019 09/09/2017 06/08/2017  Glucose 65 - 99 mg/dL 97 509(T) 99  BUN 6 - 24 mg/dL 17 16 13   Creatinine 0.76 - 1.27 mg/dL 2.67 1.24  BUN/Creat Ratio 9 - 20 15 - -  Sodium 134 - 144 mmol/L 135 138 135  Potassium 3.5 - 5.2 mmol/L 4.5 4.5 4.2  Chloride 96 - 106 mmol/L 101 105 103  CO2 20 -  29 mmol/L 21 28 25   Calcium 8.7 - 10.2 mg/dL 9.3 9.0 9.3   Chest imaging: CT Chest 04/10/21 Stable bilateral pulmonary nodules are noted compared to prior exam, and these can be considered benign at this point with no further follow-up required.   Calcified subcarinal and right hilar adenopathy is noted most consistent with chronic granulomatous disease.  CT Coronary Scan 02/28/2019 Pulmonary nodules in the right upper and lower lobes, the largest 7 mm. No musculoskeletal abnormality.  PFT: No flowsheet data found.  Myocardial Perfusion Scan 2018 The left ventricular ejection fraction is normal (55-65%). Nuclear stress EF: 55%. Blood pressure demonstrated a hypertensive response to exercise. There was no ST segment deviation noted during stress. There were occasional PVCs vs. PACs with aberration noted. There is a medium defect of moderate severity present in the basal inferior, mid inferior and apical inferior location. This is partially reversible and in the setting of normal LVF, likely represents variations in diaphragmatic attenuation artifact This is a low risk study.  Assessment & Plan:   Chest pressure  Pulmonary nodules  Discussion: Scott Norris is a 45 year old male, never smoker with history of GERD, obstructive sleep apnea and obesity who returns to pulmonary clinic for chest pressure.   The etiology of his left-sided chest pressure is most likely due to obesity with increased amount of visceral fat along with gastric distention leading to compression of the left hemidiaphragm. The other concern is if he has a degree of gastroparesis that is leading to the gastric distention and left sided pressure when eating.  He has  no pulmonary etiology leading to this pressure. The pulmonary nodules are low risk as he does not have a high risk history of smoking. The calcified subcarinal lymph node is unchanged over the past 5 years.   I recommended a significant amount of  weight loss and if achieved, and the symptoms remain he should be evaluated for gastroparesis with gastric emptying study.   Follow up as needed.  Melody Comas, MD [facility] Office: [phone]   Current Outpatient Medications:    pantoprazole (PROTONIX) 40 MG tablet, Take by mouth., Disp: , Rfl:    PRESCRIPTION MEDICATION, Inhale into the lungs at bedtime. CPAP, Disp: , Rfl:

## 2021-06-03 NOTE — Patient Instructions (Signed)
There are no concerning abnormalities on your CT chest scan for the left sided discomfort.  I believe this is due to excess visceral fat within the abdomen leading to anatomical compression when eating. There may also be a concern for gastroparesis (slow emptying of the stomach) leading to the prolonged fullness with meals. We can consider GI evaluation once weight loss has occurred.   Goal weight loss should be 20 to 30lbs initially.  Please call if you have any questions in the future or need referral to GI.

## 2021-06-04 ENCOUNTER — Encounter: Payer: Self-pay | Admitting: Pulmonary Disease

## 2021-09-18 IMAGING — CT CT CHEST W/O CM
2 of 3 series · 15 of 36 positions shown, 18 images · non-contrast
Comparison: February 28, 2019.

CLINICAL DATA: Cough, chest pain.

EXAM:
CT CHEST WITHOUT CONTRAST
TECHNIQUE: Multidetector CT imaging of the chest was performed following the
standard protocol without IV contrast.

[Series 2: thorax · axial · 0.96mm/px · z∈[-290,+4]mm · 12 of 173 slices shown, 15 images]
[im 13/173  mediastinal]
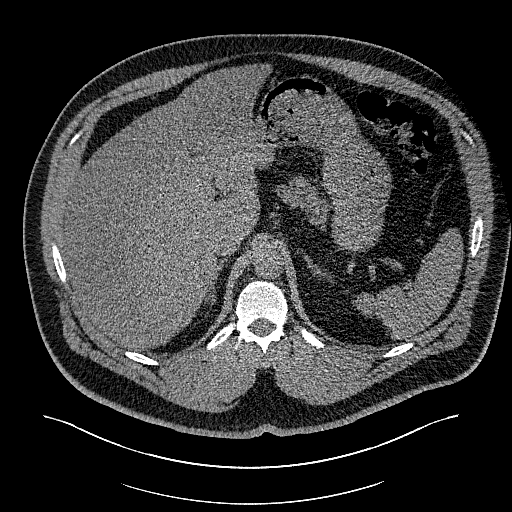
[im 13/173  lung]
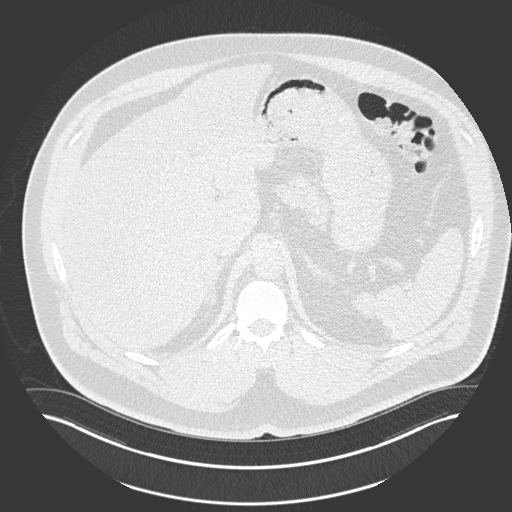
[im 26/173  lung]
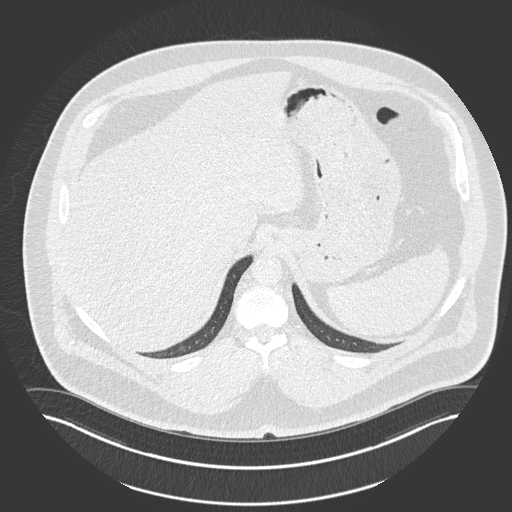
[im 39/173  lung]
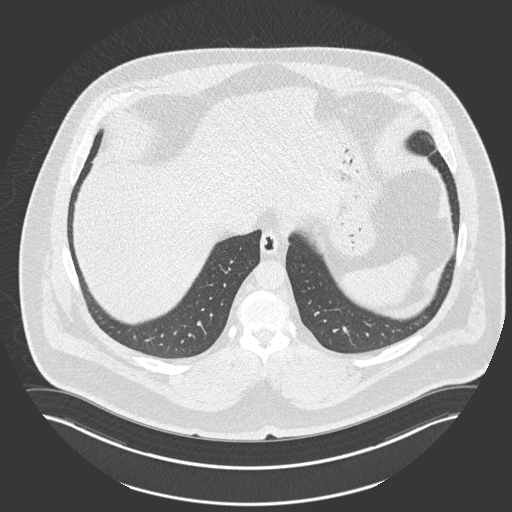
[im 51/173  lung]
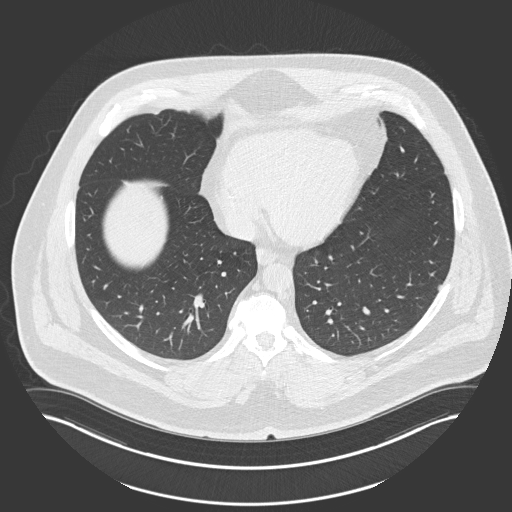
[im 64/173  mediastinal]
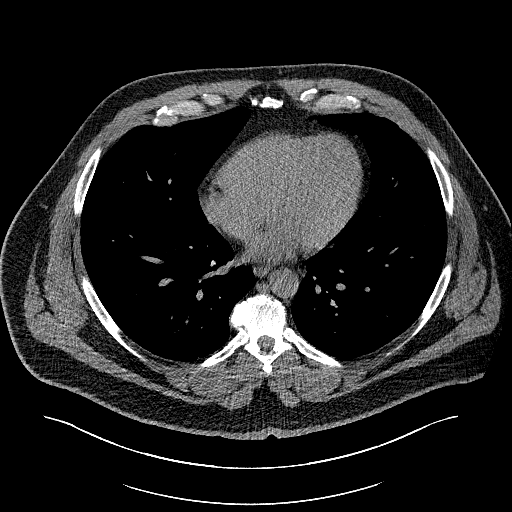
[im 64/173  lung]
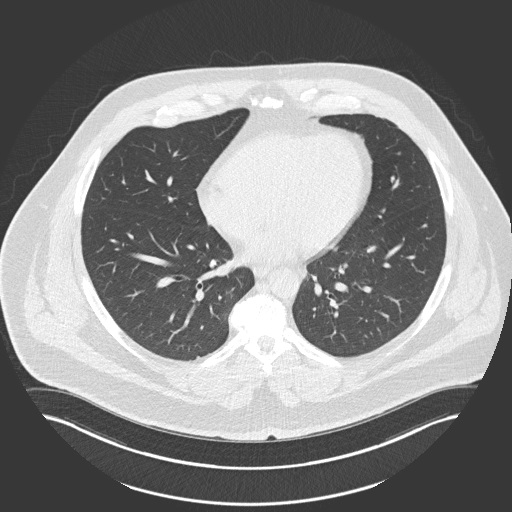
[im 77/173  lung]
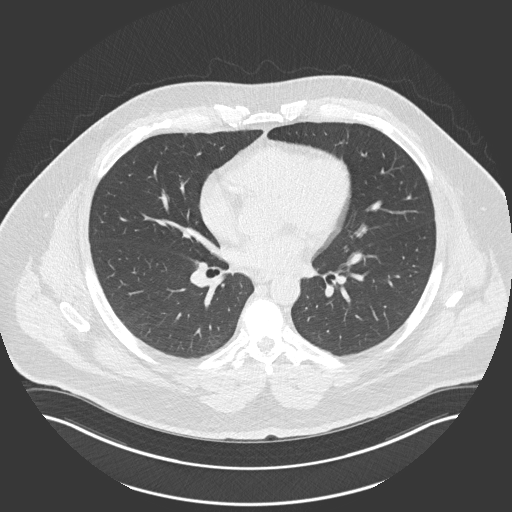
[im 96/173  lung]
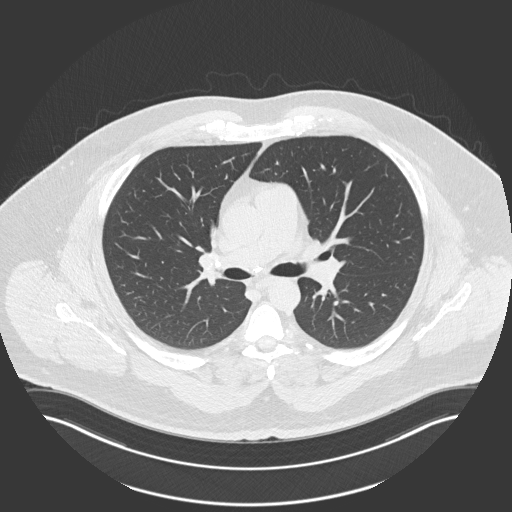
[im 109/173  lung]
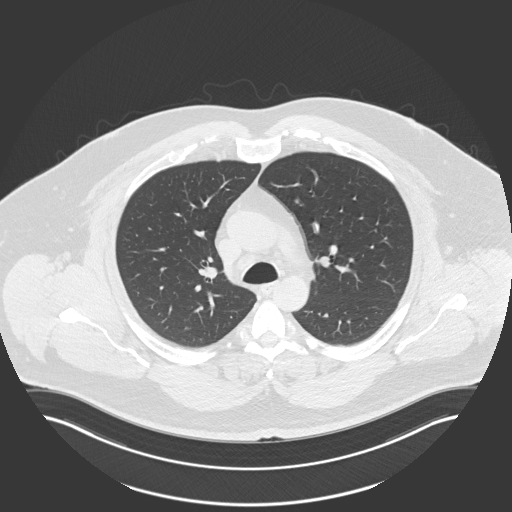
[im 122/173  mediastinal]
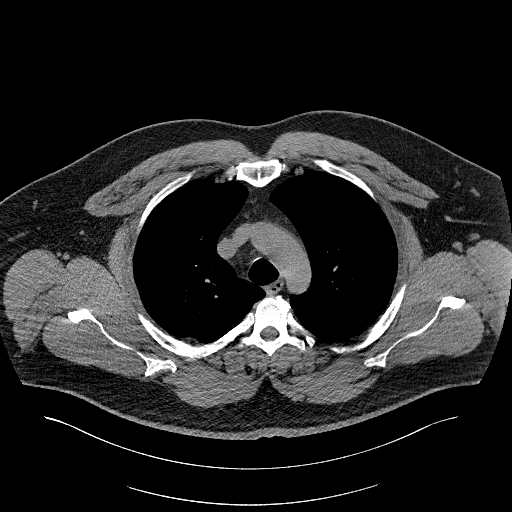
[im 122/173  lung]
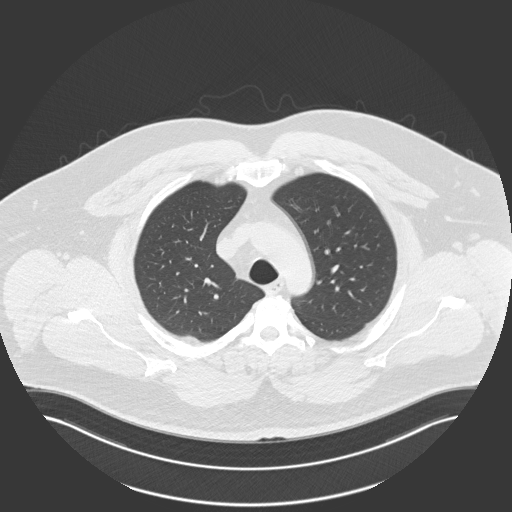
[im 134/173  lung]
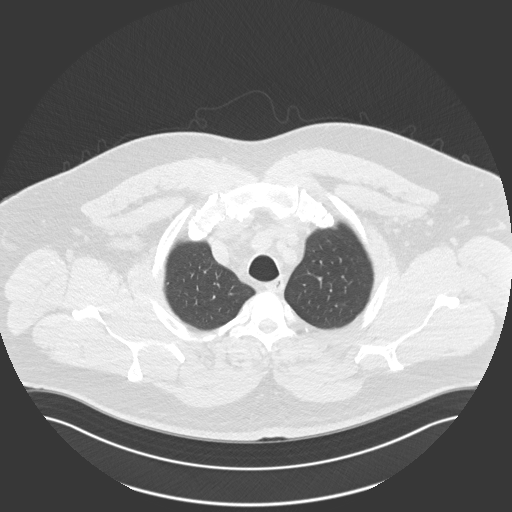
[im 147/173  lung]
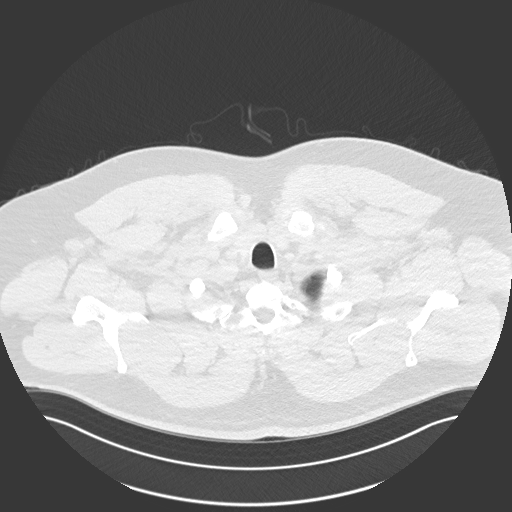
[im 160/173  lung]
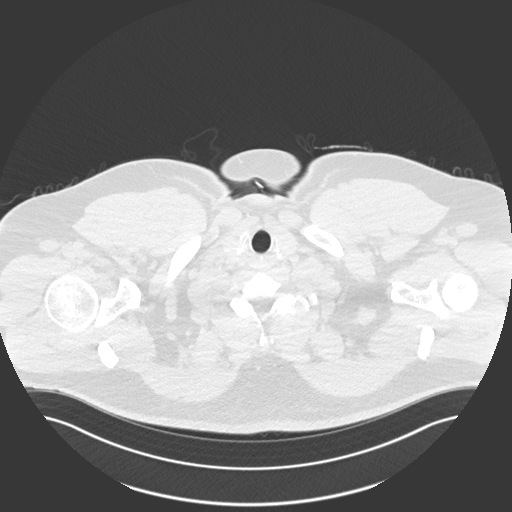

[Series 5: coronal · coronal · 0.70mm/px · 3 of 202 slices shown]
[im 41/202  lung]
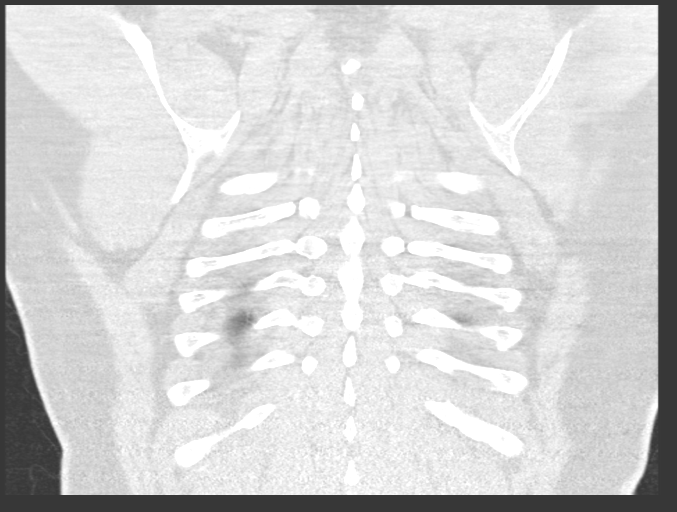
[im 81/202  lung]
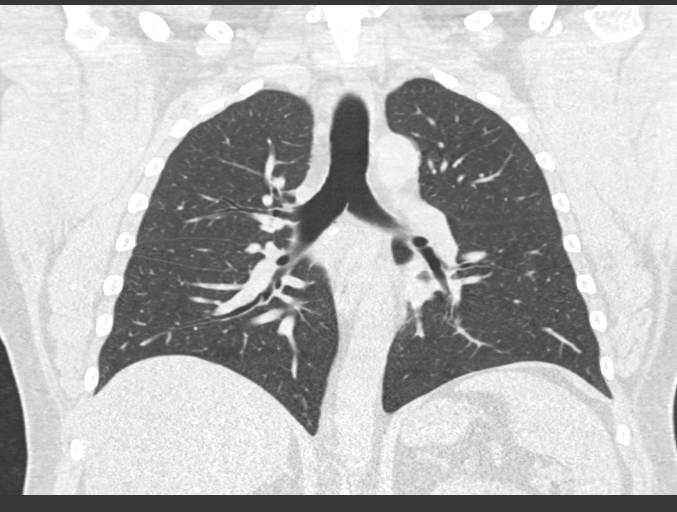
[im 121/202  lung]
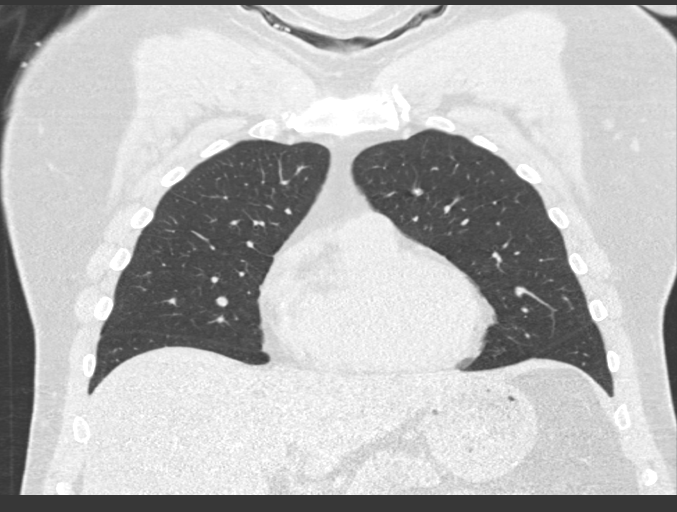

[15 of 36 positions shown; findings below may reference images not displayed]

FINDINGS: Cardiovascular: No significant vascular findings. Normal heart size.
No pericardial effusion.

Mediastinum/Nodes: Thyroid gland and esophagus are unremarkable.
Calcified subcarinal and right hilar adenopathy is noted most
consistent with prior granulomatous disease.

Lungs/Pleura: No pneumothorax or pleural effusion is noted. Stable 6
mm subpleural nodule is noted posteriorly in the left lower lobe
best seen on image number 122 of series 3, which can be considered
benign at this point with no further follow-up required. Stable 7 mm
nodular density is seen in right middle lobe adjacent to the minor
fissure most consistent with intrapulmonary lymph node. Stable 7 mm
nodule is noted in right lower lobe best seen on image number 94
series 3; this can be considered benign at this point with no
further follow-up required.

Upper Abdomen: No acute abnormality.

Musculoskeletal: No chest wall mass or suspicious bone lesions
identified.
IMPRESSION: Stable bilateral pulmonary nodules are noted compared to prior exam,
and these can be considered benign at this point with no further
follow-up required.

Calcified subcarinal and right hilar adenopathy is noted most
consistent with chronic granulomatous disease.
